# Patient Record
Sex: Male | Born: 1955 | Race: Black or African American | Hispanic: No | Marital: Married | Smoking: Current every day smoker
Health system: Southern US, Community
[De-identification: ages and names within clinical notes are randomized; demographics above are authoritative.]

## PROBLEM LIST (undated history)

## (undated) DIAGNOSIS — D369 Benign neoplasm, unspecified site: Secondary | ICD-10-CM

## (undated) DIAGNOSIS — C801 Malignant (primary) neoplasm, unspecified: Secondary | ICD-10-CM

## (undated) DIAGNOSIS — I10 Essential (primary) hypertension: Secondary | ICD-10-CM

## (undated) HISTORY — DX: Essential (primary) hypertension: I10

## (undated) HISTORY — DX: Benign neoplasm, unspecified site: D36.9

---

## 2009-04-23 ENCOUNTER — Ambulatory Visit (HOSPITAL_COMMUNITY): Admission: RE | Admit: 2009-04-23 | Discharge: 2009-04-23 | Payer: Self-pay | Admitting: Family Medicine

## 2009-04-23 IMAGING — CR DG CHEST 2V
2 series · 2 of 2 positions shown · non-contrast
Comparison: None

CLINICAL DATA: Smoking history

CHEST - 2 VIEW

[view not recorded (1 of 2)]
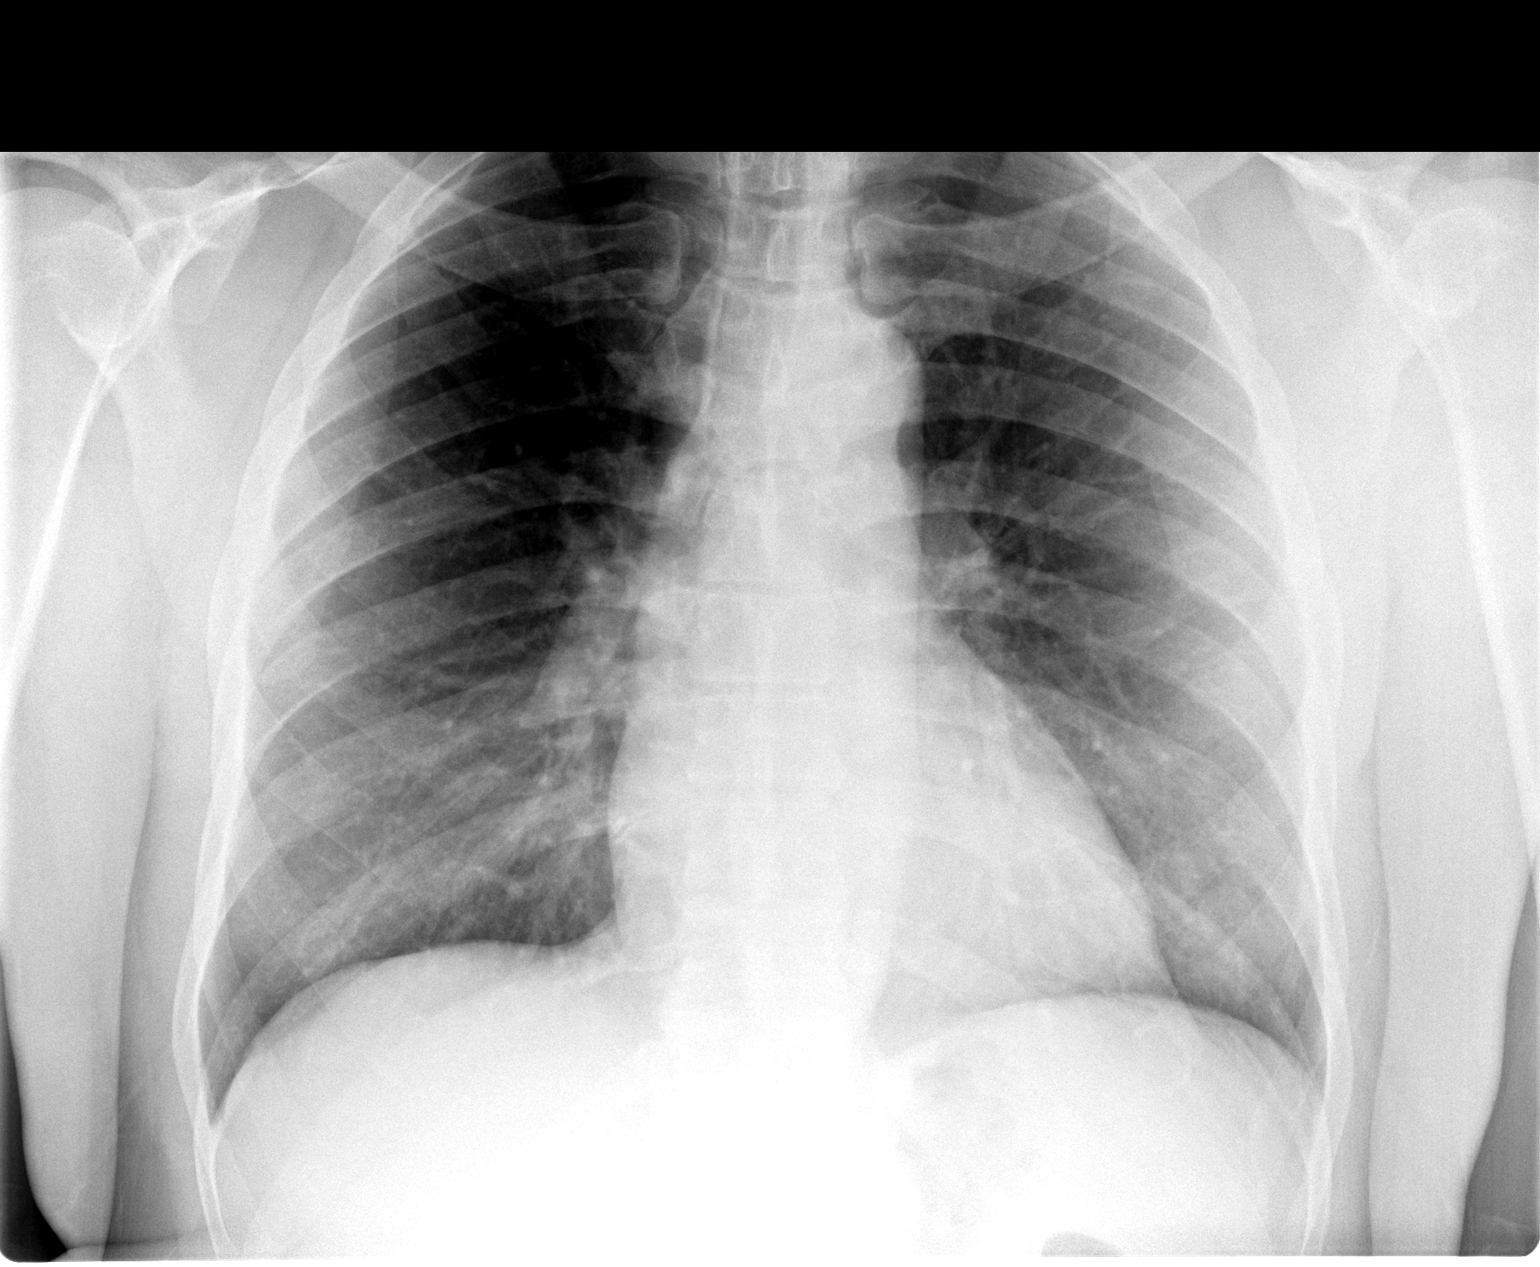

[view not recorded (2 of 2)]
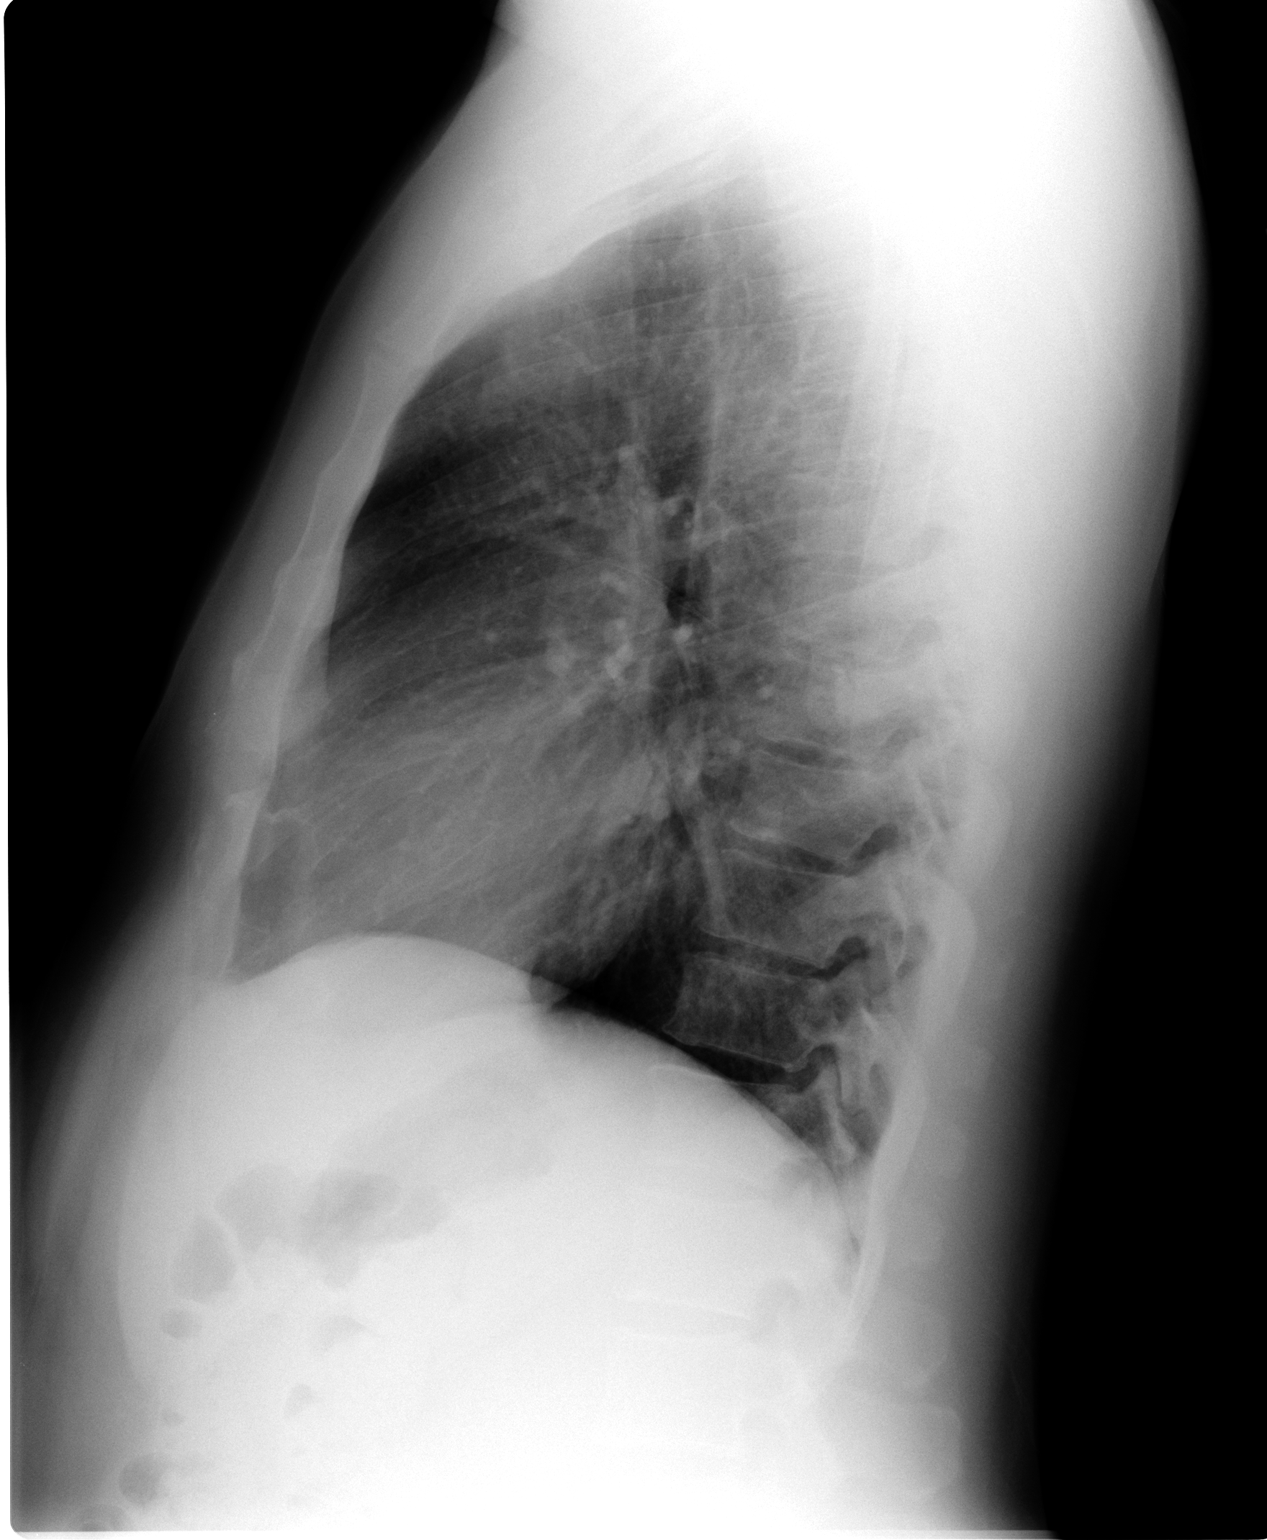

[2 of 2 positions shown; findings below may reference images not displayed]

FINDINGS: Heart size is normal.  The mediastinum is unremarkable.
Lungs are clear.  No effusions.  No soft tissue or bony finding.
IMPRESSION: Normal chest

## 2009-05-04 ENCOUNTER — Encounter: Payer: Self-pay | Admitting: Internal Medicine

## 2009-05-22 ENCOUNTER — Encounter: Payer: Self-pay | Admitting: Internal Medicine

## 2009-05-22 ENCOUNTER — Ambulatory Visit: Payer: Self-pay | Admitting: Internal Medicine

## 2009-05-22 ENCOUNTER — Ambulatory Visit (HOSPITAL_COMMUNITY): Admission: RE | Admit: 2009-05-22 | Discharge: 2009-05-22 | Payer: Self-pay | Admitting: Internal Medicine

## 2009-05-22 HISTORY — PX: COLONOSCOPY: SHX174

## 2009-05-31 ENCOUNTER — Encounter: Payer: Self-pay | Admitting: Internal Medicine

## 2011-03-22 NOTE — Op Note (Signed)
NAMEDOROTEO, NICKOLSON            ACCOUNT NO.:  [id number]   MEDICAL RECORD NO.:  [id number]          PATIENT TYPE:  AMB   LOCATION:  DAY                           FACILITY:  [facility]   PHYSICIAN:  R. Roetta Sessions, M.D. DATE OF BIRTH:  03-22-1956   DATE OF PROCEDURE:  05/22/2009  DATE OF DISCHARGE:                               OPERATIVE REPORT   PROCEDURE:  Colonoscopy with snare polypectomy.   INDICATIONS FOR PROCEDURE:  This is a 55 year old African American male  seen at the courtesy of Dr. Yetta Numbers due for colorectal cancer  screening.  He has never had his lower GI tract imaged and has no lower  GI tract symptoms and there is no family history of colon polyps or  colon cancer.  Colonoscopy is now being done as screening maneuver.  The  risks, benefits, alternatives and limitations have been reviewed,  questions answered.  Please see the documentation in the medical record.   PROCEDURE NOTE:  O2 saturation, blood pressure, pulse and respirations  were monitored throughout the entire procedure.  Conscious sedation with  Versed 4 mg IV and Demerol 75 mg IV in divided doses.   INSTRUMENT:  Pentax video chip system.   FINDINGS:  Digital rectal exam revealed no abnormalities.   ENDOSCOPIC FINDINGS:  The prep was adequate.  Colon:  Colonic mucosa was  surveyed from the rectosigmoid junction through the left transverse and  right colon to the appendiceal orifice, ileocecal valve/cecum.  These  structures were well seen and photographed for the record.  From this  level, the scope was slowly withdrawn.  All previously mentioned mucosal  surfaces were again seen.  The patient had an 8-mm polyp on a stalk at  the splenic flexure and a 9-mm polyp in the midsigmoid.  Both of these  polyps were resected with hot snare cautery and recovered.  The  remainder of the colonic mucosa appeared normal.  The scope was pulled  down in the rectum where a thorough examination of the rectal  mucosa,  including a retroflexed view of the anal verge demonstrated no  abnormalities.  The patient tolerated the procedure well and was reacted  in endoscopy.  Cecal withdrawal time 10 minutes.   IMPRESSION:  Normal rectum.  Polyps at the splenic flexure and sigmoid  as described above, status post hot snare removal.  The remainder of  colonic mucosa appeared normal.   RECOMMENDATIONS:  1. No aspirin or arthritic medications for 5 days.  2. Follow-up on path.  3. Further recommendations to follow.      Jonathon Bellows, M.D.  Electronically Signed     RMR/MEDQ  D:  05/22/2009  T:  05/22/2009  Job:  [id number]   cc:   Kirk Ruths, M.D.  Fax: [phone]

## 2014-05-13 ENCOUNTER — Encounter: Payer: Self-pay | Admitting: Internal Medicine

## 2014-07-18 ENCOUNTER — Ambulatory Visit: Payer: Self-pay | Admitting: Internal Medicine

## 2014-07-29 ENCOUNTER — Ambulatory Visit (INDEPENDENT_AMBULATORY_CARE_PROVIDER_SITE_OTHER): Payer: BC Managed Care – PPO | Admitting: Internal Medicine

## 2014-07-29 ENCOUNTER — Other Ambulatory Visit: Payer: Self-pay

## 2014-07-29 ENCOUNTER — Encounter: Payer: Self-pay | Admitting: Internal Medicine

## 2014-07-29 VITALS — BP 126/79 | HR 90 | Temp 97.6°F | Ht 72.0 in | Wt 231.4 lb

## 2014-07-29 DIAGNOSIS — Z860100 Personal history of colon polyps, unspecified: Secondary | ICD-10-CM

## 2014-07-29 MED ORDER — PEG-KCL-NACL-NASULF-NA ASC-C 100 G PO SOLR: 1.0000 | ORAL | Status: DC

## 2014-07-29 NOTE — Progress Notes (Signed)
    Primary Care Physician:  No primary provider on file. Primary Gastroenterologist:  Dr. Gala Romney  Pre-Procedure History & Physical: HPI:  Jamie Gonzalez is a 58 y.o. male here to set up surveillance colonoscopy. Had 2 adenomas removed from his colon in 2010. No bowel symptoms currently. No family history of colon cancer. No interim health problems.   Past Medical History  Diagnosis Date  . Tubular adenoma     Past Surgical History  Procedure Laterality Date  . Colonoscopy  05/22/2009    Dr.Michaele Amundson- normal rectum, polyps at the splenic flexure and sigmoid, the remainder of the colonic mucosa appeared normal. bx= tubular adenoma    Prior to Admission medications   Medication Sig Start Date End Date Taking? Authorizing Provider  Multiple Vitamin (MULTIVITAMIN) tablet Take 1 tablet by mouth daily.   Yes Historical Provider, MD    Allergies as of 07/29/2014  . (No Known Allergies)    No family history on file.  History   Social History  . Marital Status: Married    Spouse Name: N/A    Number of Children: N/A  . Years of Education: N/A   Occupational History  . Not on file.   Social History Main Topics  . Smoking status: Current Every Day Smoker -- 0.50 packs/day    Types: Cigarettes  . Smokeless tobacco: Not on file     Comment: 1/2 pack daily  . Alcohol Use: Not on file  . Drug Use: Not on file  . Sexual Activity: Not on file   Other Topics Concern  . Not on file   Social History Narrative  . No narrative on file    Review of Systems: See HPI, otherwise negative ROS  Physical Exam: BP 126/79  Pulse 90  Temp(Src) 97.6 F (36.4 C) (Oral)  Ht 6' (1.829 m)  Wt 231 lb 6.4 oz (104.962 kg)  BMI 31.38 kg/m2 General:   Alert,  Well-developed, well-nourished, pleasant and cooperative in NAD. Accompanied by his wife. Skin:  Intact without significant lesions or rashes. Eyes:  Sclera clear, no icterus.   Conjunctiva pink. Ears:  Normal auditory acuity. Nose:   No deformity, discharge,  or lesions. Mouth:  No deformity or lesions. Neck:  Supple; no masses or thyromegaly. No significant cervical adenopathy. Lungs:  Clear throughout to auscultation.   No wheezes, crackles, or rhonchi. No acute distress. Heart:  Regular rate and rhythm; no murmurs, clicks, rubs,  or gallops. Abdomen: Non-distended, normal bowel sounds.  Soft and nontender without appreciable mass or hepatosplenomegaly.  Pulses:  Normal pulses noted. Extremities:  Without clubbing or edema.  Impression:  Pleasant 58 year old gentleman with a history of colonic adenomas; due for surveillance examination at this time.  The risks, benefits, limitations, imponderables and alternatives regarding both EGD and colonoscopy have been reviewed with the patient. Questions have been answered. All parties agreeable.   Recommendations:  Schedule a surveillance colonoscopy (history of polyps)  Split movie prep  Further recommendations to follow    Notice: This dictation was prepared with Dragon dictation along with smaller phrase technology. Any transcriptional errors that result from this process are unintentional and may not be corrected upon review.

## 2014-07-29 NOTE — Patient Instructions (Signed)
Schedule a surveillance colonoscopy (history of polyps)  Split movie prep  Further recommendations to follow

## 2014-08-05 ENCOUNTER — Encounter (HOSPITAL_COMMUNITY): Payer: Self-pay | Admitting: Pharmacy Technician

## 2014-08-06 DIAGNOSIS — Z860100 Personal history of colon polyps, unspecified: Secondary | ICD-10-CM | POA: Insufficient documentation

## 2014-08-19 ENCOUNTER — Encounter (HOSPITAL_COMMUNITY)
Admission: RE | Disposition: A | Discharge status: Home / Self Care | Payer: Self-pay | Source: Ambulatory Visit | Attending: Internal Medicine

## 2014-08-19 ENCOUNTER — Encounter (HOSPITAL_COMMUNITY): Payer: Self-pay

## 2014-08-19 ENCOUNTER — Ambulatory Visit (HOSPITAL_COMMUNITY)
Admission: RE | Admit: 2014-08-19 | Discharge: 2014-08-19 | Disposition: A | Discharge status: Home / Self Care | Payer: BC Managed Care – PPO | Source: Ambulatory Visit | Attending: Internal Medicine | Admitting: Internal Medicine

## 2014-08-19 DIAGNOSIS — F17200 Nicotine dependence, unspecified, uncomplicated: Secondary | ICD-10-CM | POA: Diagnosis not present

## 2014-08-19 DIAGNOSIS — Z86010 Personal history of colonic polyps: Secondary | ICD-10-CM | POA: Insufficient documentation

## 2014-08-19 DIAGNOSIS — Z1211 Encounter for screening for malignant neoplasm of colon: Secondary | ICD-10-CM | POA: Diagnosis present

## 2014-08-19 HISTORY — PX: COLONOSCOPY: SHX5424

## 2014-08-19 SURGERY — COLONOSCOPY
Anesthesia: Moderate Sedation

## 2014-08-19 MED ORDER — MIDAZOLAM HCL 5 MG/5ML IJ SOLN
INTRAMUSCULAR | Status: AC
  Filled 2014-08-19: qty 10

## 2014-08-19 MED ORDER — SIMETHICONE 40 MG/0.6ML PO SUSP
ORAL | Status: DC | PRN
  Administered 2014-08-19: 11:00:00

## 2014-08-19 MED ORDER — ONDANSETRON HCL 4 MG/2ML IJ SOLN
INTRAMUSCULAR | Status: DC | PRN
  Administered 2014-08-19: 4 mg via INTRAVENOUS

## 2014-08-19 MED ORDER — MEPERIDINE HCL 100 MG/ML IJ SOLN
INTRAMUSCULAR | Status: DC | PRN
  Administered 2014-08-19 (×2): 50 mg via INTRAVENOUS

## 2014-08-19 MED ORDER — MIDAZOLAM HCL 5 MG/5ML IJ SOLN
INTRAMUSCULAR | Status: DC | PRN
  Administered 2014-08-19 (×2): 2 mg via INTRAVENOUS

## 2014-08-19 MED ORDER — ONDANSETRON HCL 4 MG/2ML IJ SOLN
INTRAMUSCULAR | Status: AC
  Filled 2014-08-19: qty 2

## 2014-08-19 MED ORDER — SODIUM CHLORIDE 0.9 % IV SOLN
INTRAVENOUS | Status: DC
  Administered 2014-08-19: 11:00:00 via INTRAVENOUS

## 2014-08-19 MED ORDER — MEPERIDINE HCL 100 MG/ML IJ SOLN
INTRAMUSCULAR | Status: AC
  Filled 2014-08-19: qty 2

## 2014-08-19 NOTE — Discharge Instructions (Signed)
°  Colonoscopy Discharge Instructions  Read the instructions outlined below and refer to this sheet in the next few weeks. These discharge instructions provide you with general information on caring for yourself after you leave the hospital. Your doctor may also give you specific instructions. While your treatment has been planned according to the most current medical practices available, unavoidable complications occasionally occur. If you have any problems or questions after discharge, call Dr. Gala Romney at [phone]. ACTIVITY  You may resume your regular activity, but move at a slower pace for the next 24 hours.   Take frequent rest periods for the next 24 hours.   Walking will help get rid of the air and reduce the bloated feeling in your belly (abdomen).   No driving for 24 hours (because of the medicine (anesthesia) used during the test).    Do not sign any important legal documents or operate any machinery for 24 hours (because of the anesthesia used during the test).  NUTRITION  Drink plenty of fluids.   You may resume your normal diet as instructed by your doctor.   Begin with a light meal and progress to your normal diet. Heavy or fried foods are harder to digest and may make you feel sick to your stomach (nauseated).   Avoid alcoholic beverages for 24 hours or as instructed.  MEDICATIONS  You may resume your normal medications unless your doctor tells you otherwise.  WHAT YOU CAN EXPECT TODAY  Some feelings of bloating in the abdomen.   Passage of more gas than usual.   Spotting of blood in your stool or on the toilet paper.  IF YOU HAD POLYPS REMOVED DURING THE COLONOSCOPY:  No aspirin products for 7 days or as instructed.   No alcohol for 7 days or as instructed.   Eat a soft diet for the next 24 hours.  FINDING OUT THE RESULTS OF YOUR TEST Not all test results are available during your visit. If your test results are not back during the visit, make an appointment  with your caregiver to find out the results. Do not assume everything is normal if you have not heard from your caregiver or the medical facility. It is important for you to follow up on all of your test results.  SEEK IMMEDIATE MEDICAL ATTENTION IF:  You have more than a spotting of blood in your stool.   Your belly is swollen (abdominal distention).   You are nauseated or vomiting.   You have a temperature over 101.   You have abdominal pain or discomfort that is severe or gets worse throughout the day.   Repeat screening colonoscopy in 7 years

## 2014-08-19 NOTE — Op Note (Signed)
[facility] [address] [place], [address]   COLONOSCOPY PROCEDURE REPORT  PATIENT: Jamie Gonzalez, Jamie Gonzalez  MR#: [id number] BIRTHDATE: 04-19-1956 , 1  yrs. old GENDER: male ENDOSCOPIST: R.  Garfield Cornea, MD Quentin Ore REFERRED BY:   no referring physician PROCEDURE DATE:  08/26/2014 PROCEDURE:   Colonoscopy, surveillance INDICATIONS:surveillance colonoscopy based on a history of adenomatous colonic polyp(s). MEDICATIONS: Versed 4 mg IV and Demerol 100 mg IV in divided doses. Zofran 4 mg IV ASA CLASS:       Class II  CONSENT: The risks, benefits, alternatives and imponderables including but not limited to bleeding, perforation as well as the possibility of a missed lesion have been reviewed.  The potential for biopsy, lesion removal, etc. have also been discussed. Questions have been answered.  All parties agreeable.  Please see the history and physical in the medical record for more information.  DESCRIPTION OF PROCEDURE:   After the risks benefits and alternatives of the procedure were thoroughly explained, informed consent was obtained.  The digital rectal exam      The EC-[id number] ([id number])  endoscope was introduced through the anus and advanced to the cecum, which was identified by both the appendix and ileocecal valve. No adverse events experienced.   The quality of the prep was adequate.  The instrument was then slowly withdrawn as the colon was fully examined.      COLON FINDINGS: Normal appearing rectal and colonic mucosa. Retroflexion was performed. .  Withdrawal time=8 minutes 0 seconds.  The scope was withdrawn and the procedure completed. COMPLICATIONS: There were no immediate complications.  ENDOSCOPIC IMPRESSION: Normal colonoscopy  RECOMMENDATIONS: Repeat colonoscopy in 7 years  eSigned:  R. Garfield Cornea, MD Rosalita Chessman [facility] 08-26-14 12:03 PM   cc:  CPT CODES: ICD CODES:  The ICD and CPT codes recommended by this software  are interpretations from the data that the clinical staff has captured with the software.  The verification of the translation of this report to the ICD and CPT codes and modifiers is the sole responsibility of the health care institution and practicing physician where this report was generated.  [employer] will not be held responsible for the validity of the ICD and CPT codes included on this report.  AMA assumes no liability for data contained or not contained herein. CPT is a Designer, television/film set of the [employer].  PATIENT NAME:  Jamie Gonzalez, Jamie Gonzalez MR#: [id number]

## 2014-08-19 NOTE — H&P (View-Only) (Signed)
    Primary Care Physician:  No primary provider on file. Primary Gastroenterologist:  Dr. Gala Romney  Pre-Procedure History & Physical: HPI:  Jamie Gonzalez is a 58 y.o. male here to set up surveillance colonoscopy. Had 2 adenomas removed from his colon in 2010. No bowel symptoms currently. No family history of colon cancer. No interim health problems.   Past Medical History  Diagnosis Date  . Tubular adenoma     Past Surgical History  Procedure Laterality Date  . Colonoscopy  05/22/2009    Dr.Kierstin January- normal rectum, polyps at the splenic flexure and sigmoid, the remainder of the colonic mucosa appeared normal. bx= tubular adenoma    Prior to Admission medications   Medication Sig Start Date End Date Taking? Authorizing Provider  Multiple Vitamin (MULTIVITAMIN) tablet Take 1 tablet by mouth daily.   Yes Historical Provider, MD    Allergies as of 07/29/2014  . (No Known Allergies)    No family history on file.  History   Social History  . Marital Status: Married    Spouse Name: N/A    Number of Children: N/A  . Years of Education: N/A   Occupational History  . Not on file.   Social History Main Topics  . Smoking status: Current Every Day Smoker -- 0.50 packs/day    Types: Cigarettes  . Smokeless tobacco: Not on file     Comment: 1/2 pack daily  . Alcohol Use: Not on file  . Drug Use: Not on file  . Sexual Activity: Not on file   Other Topics Concern  . Not on file   Social History Narrative  . No narrative on file    Review of Systems: See HPI, otherwise negative ROS  Physical Exam: BP 126/79  Pulse 90  Temp(Src) 97.6 F (36.4 C) (Oral)  Ht 6' (1.829 m)  Wt 231 lb 6.4 oz (104.962 kg)  BMI 31.38 kg/m2 General:   Alert,  Well-developed, well-nourished, pleasant and cooperative in NAD. Accompanied by his wife. Skin:  Intact without significant lesions or rashes. Eyes:  Sclera clear, no icterus.   Conjunctiva pink. Ears:  Normal auditory acuity. Nose:   No deformity, discharge,  or lesions. Mouth:  No deformity or lesions. Neck:  Supple; no masses or thyromegaly. No significant cervical adenopathy. Lungs:  Clear throughout to auscultation.   No wheezes, crackles, or rhonchi. No acute distress. Heart:  Regular rate and rhythm; no murmurs, clicks, rubs,  or gallops. Abdomen: Non-distended, normal bowel sounds.  Soft and nontender without appreciable mass or hepatosplenomegaly.  Pulses:  Normal pulses noted. Extremities:  Without clubbing or edema.  Impression:  Pleasant 58 year old gentleman with a history of colonic adenomas; due for surveillance examination at this time.  The risks, benefits, limitations, imponderables and alternatives regarding both EGD and colonoscopy have been reviewed with the patient. Questions have been answered. All parties agreeable.   Recommendations:  Schedule a surveillance colonoscopy (history of polyps)  Split movie prep  Further recommendations to follow    Notice: This dictation was prepared with Dragon dictation along with smaller phrase technology. Any transcriptional errors that result from this process are unintentional and may not be corrected upon review.

## 2014-08-19 NOTE — Interval H&P Note (Signed)
History and Physical Interval Note:  08/19/2014 11:32 AM  Jamie Gonzalez  has presented today for surgery, with the diagnosis of history of polyps  The various methods of treatment have been discussed with the patient and family. After consideration of risks, benefits and other options for treatment, the patient has consented to  Procedure(s) with comments: COLONOSCOPY (N/A) - 1130 as a surgical intervention .  The patient's history has been reviewed, patient examined, no change in status, stable for surgery.  I have reviewed the patient's chart and labs.  Questions were answered to the patient's satisfaction.     Jamie Gonzalez  No change. Colonoscopy per plan.  The risks, benefits, limitations, alternatives and imponderables have been reviewed with the patient. Questions have been answered. All parties are agreeable.

## 2014-08-20 ENCOUNTER — Encounter (HOSPITAL_COMMUNITY): Payer: Self-pay | Admitting: Internal Medicine

## 2019-12-06 ENCOUNTER — Ambulatory Visit: Payer: Self-pay | Attending: Internal Medicine

## 2019-12-06 ENCOUNTER — Other Ambulatory Visit: Payer: Self-pay

## 2019-12-06 DIAGNOSIS — Z20822 Contact with and (suspected) exposure to covid-19: Secondary | ICD-10-CM | POA: Insufficient documentation

## 2019-12-07 LAB — NOVEL CORONAVIRUS, NAA: SARS-CoV-2, NAA: NOT DETECTED

## 2020-01-02 ENCOUNTER — Encounter: Payer: Self-pay | Admitting: Physician Assistant

## 2020-01-02 ENCOUNTER — Ambulatory Visit: Payer: Self-pay | Attending: Internal Medicine

## 2020-01-02 DIAGNOSIS — Z20822 Contact with and (suspected) exposure to covid-19: Secondary | ICD-10-CM

## 2020-01-02 DIAGNOSIS — U071 COVID-19: Secondary | ICD-10-CM | POA: Insufficient documentation

## 2020-01-03 LAB — NOVEL CORONAVIRUS, NAA: SARS-CoV-2, NAA: DETECTED — AB

## 2020-01-04 ENCOUNTER — Other Ambulatory Visit: Payer: Self-pay | Admitting: Physician Assistant

## 2020-01-04 ENCOUNTER — Other Ambulatory Visit: Payer: Self-pay | Admitting: Adult Health

## 2020-01-04 ENCOUNTER — Telehealth: Payer: Self-pay

## 2020-01-04 ENCOUNTER — Ambulatory Visit (HOSPITAL_COMMUNITY)
Admission: RE | Admit: 2020-01-04 | Discharge: 2020-01-04 | Disposition: A | Discharge status: Home / Self Care | Payer: HRSA Program | Source: Ambulatory Visit | Attending: Pulmonary Disease | Admitting: Pulmonary Disease

## 2020-01-04 DIAGNOSIS — Z23 Encounter for immunization: Secondary | ICD-10-CM | POA: Diagnosis present

## 2020-01-04 DIAGNOSIS — U071 COVID-19: Secondary | ICD-10-CM | POA: Diagnosis not present

## 2020-01-04 DIAGNOSIS — I10 Essential (primary) hypertension: Secondary | ICD-10-CM | POA: Diagnosis not present

## 2020-01-04 MED ORDER — SODIUM CHLORIDE 0.9 % IV SOLN
INTRAVENOUS | Status: DC | PRN
  Administered 2020-01-04: 250 mL via INTRAVENOUS

## 2020-01-04 MED ORDER — DIPHENHYDRAMINE HCL 50 MG/ML IJ SOLN: 50.0000 mg | Freq: Once | INTRAMUSCULAR | Status: DC | PRN

## 2020-01-04 MED ORDER — SODIUM CHLORIDE 0.9 % IV SOLN
700.0000 mg | Freq: Once | INTRAVENOUS | Status: AC
  Administered 2020-01-04: 11:00:00 700 mg via INTRAVENOUS
  Filled 2020-01-04: qty 20

## 2020-01-04 MED ORDER — EPINEPHRINE 0.3 MG/0.3ML IJ SOAJ: 0.3000 mg | Freq: Once | INTRAMUSCULAR | Status: DC | PRN

## 2020-01-04 MED ORDER — FAMOTIDINE IN NACL 20-0.9 MG/50ML-% IV SOLN: 20.0000 mg | Freq: Once | INTRAVENOUS | Status: DC | PRN

## 2020-01-04 MED ORDER — ALBUTEROL SULFATE HFA 108 (90 BASE) MCG/ACT IN AERS: 2.0000 | INHALATION_SPRAY | Freq: Once | RESPIRATORY_TRACT | Status: DC | PRN

## 2020-01-04 MED ORDER — METHYLPREDNISOLONE SODIUM SUCC 125 MG IJ SOLR: 125.0000 mg | Freq: Once | INTRAMUSCULAR | Status: DC | PRN

## 2020-01-04 NOTE — Telephone Encounter (Signed)
[facility] form [place] called for pt's additional phone numbers.

## 2020-01-04 NOTE — Progress Notes (Signed)
  I connected by phone with Jamie Gonzalez on 01/04/2020 at 8:33 AM to discuss the potential use of an new treatment for mild to moderate COVID-19 viral infection in non-hospitalized patients.  This patient is a 64 y.o. male that meets the FDA criteria for Emergency Use Authorization of bamlanivimab or casirivimab\imdevimab.  Has a (+) direct SARS-CoV-2 viral test result  Has mild or moderate COVID-19   Is ? 65 years of age and weighs ? 40 kg  Is NOT hospitalized due to COVID-19  Is NOT requiring oxygen therapy or requiring an increase in baseline oxygen flow rate due to COVID-19  Is within 10 days of symptom onset  Has at least one of the high risk factor(s) for progression to severe COVID-19 and/or hospitalization as defined in EUA.  Specific high risk criteria : Hypertension   I have spoken and communicated the following to the patient or parent/caregiver:  1. FDA has authorized the emergency use of bamlanivimab and casirivimab\imdevimab for the treatment of mild to moderate COVID-19 in adults and pediatric patients with positive results of direct SARS-CoV-2 viral testing who are 12 years of age and older weighing at least 40 kg, and who are at high risk for progressing to severe COVID-19 and/or hospitalization.  2. The significant known and potential risks and benefits of bamlanivimab and casirivimab\imdevimab, and the extent to which such potential risks and benefits are unknown.  3. Information on available alternative treatments and the risks and benefits of those alternatives, including clinical trials.  4. Patients treated with bamlanivimab and casirivimab\imdevimab should continue to self-isolate and use infection control measures (e.g., wear mask, isolate, social distance, avoid sharing personal items, clean and disinfect "high touch" surfaces, and frequent handwashing) according to CDC guidelines.   5. The patient or parent/caregiver has the option to accept or refuse  bamlanivimab or casirivimab\imdevimab .  After reviewing this information with the patient, The patient agreed to proceed with receiving the bamlanimivab infusion and will be provided a copy of the Fact sheet prior to receiving the infusion.  Scheduled 01/04/20 at 1030  . [name] 01/04/2020 8:33 AM

## 2020-01-04 NOTE — Progress Notes (Signed)
  Diagnosis: COVID-19  Physician:  Procedure: [facility] Med: bamlanivimab infusion - Provided patient with bamlanimivab fact sheet for patients, parents and caregivers prior to infusion.  Complications: No immediate complications noted.  Discharge: Discharged home   Virgilio Belling 01/04/2020

## 2020-06-11 DIAGNOSIS — E6609 Other obesity due to excess calories: Secondary | ICD-10-CM | POA: Diagnosis not present

## 2020-06-11 DIAGNOSIS — Z1389 Encounter for screening for other disorder: Secondary | ICD-10-CM | POA: Diagnosis not present

## 2020-06-11 DIAGNOSIS — Z6831 Body mass index (BMI) 31.0-31.9, adult: Secondary | ICD-10-CM | POA: Diagnosis not present

## 2020-06-11 DIAGNOSIS — E7849 Other hyperlipidemia: Secondary | ICD-10-CM | POA: Diagnosis not present

## 2020-06-11 DIAGNOSIS — Z0000 Encounter for general adult medical examination without abnormal findings: Secondary | ICD-10-CM | POA: Diagnosis not present

## 2020-11-12 DIAGNOSIS — Z23 Encounter for immunization: Secondary | ICD-10-CM | POA: Diagnosis not present

## 2021-04-01 ENCOUNTER — Other Ambulatory Visit (HOSPITAL_COMMUNITY): Payer: Self-pay | Admitting: Physician Assistant

## 2021-04-07 ENCOUNTER — Ambulatory Visit (HOSPITAL_COMMUNITY)
Admission: RE | Admit: 2021-04-07 | Discharge: 2021-04-07 | Disposition: A | Discharge status: Home / Self Care | Payer: Self-pay | Source: Ambulatory Visit | Attending: Physician Assistant | Admitting: Physician Assistant

## 2021-04-07 ENCOUNTER — Other Ambulatory Visit: Payer: Self-pay

## 2021-04-07 DIAGNOSIS — Z0000 Encounter for general adult medical examination without abnormal findings: Secondary | ICD-10-CM | POA: Insufficient documentation

## 2021-04-07 DIAGNOSIS — I700 Atherosclerosis of aorta: Secondary | ICD-10-CM | POA: Insufficient documentation

## 2021-04-07 IMAGING — CT CT CARDIAC CORONARY ARTERY CALCIUM SCORE
3 series · 14 of 20 positions shown, 15 images · non-contrast
Comparison: None.

Addendum:
CLINICAL DATA: 64M for cardiovascular disease risk stratification

EXAM:
Coronary Calcium Score
TECHNIQUE: A gated, non-contrast computed tomography scan of the heart was
performed using 3mm slice thickness. Axial images were analyzed on a
dedicated workstation. Calcium scoring of the coronary arteries was
performed using the Agatston method.

[Series 3: ax ca scr 70% (id) · axial · 0.39mm/px · z∈[+1378,+1484]mm · 6 of 75 slices shown]
[im 11/75  vessel]
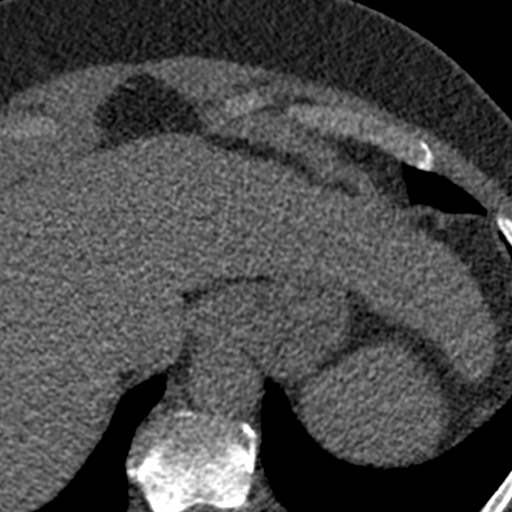
[im 22/75  vessel]
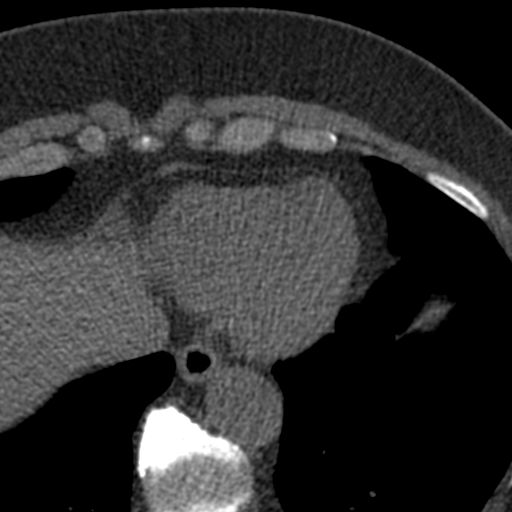
[im 32/75  vessel]
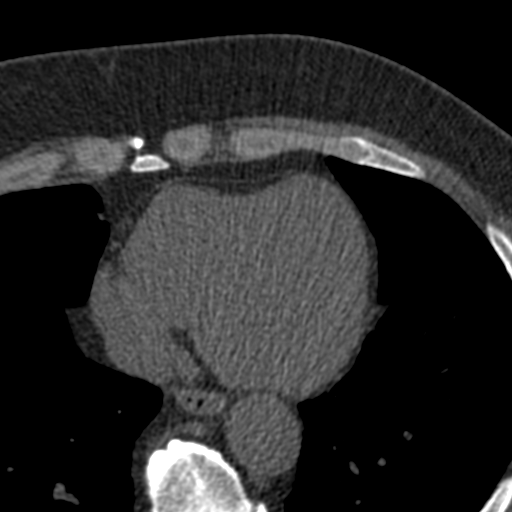
[im 43/75  vessel]
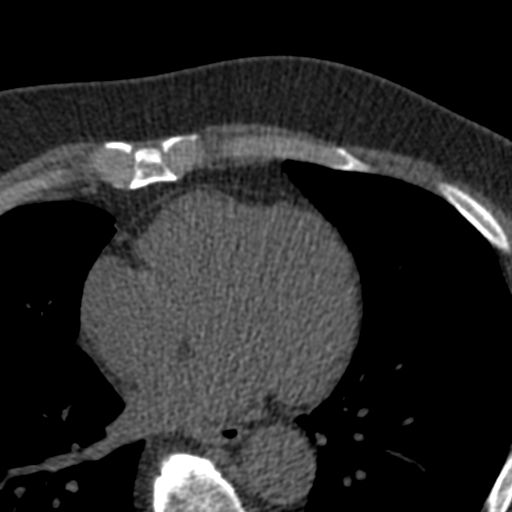
[im 53/75  vessel]
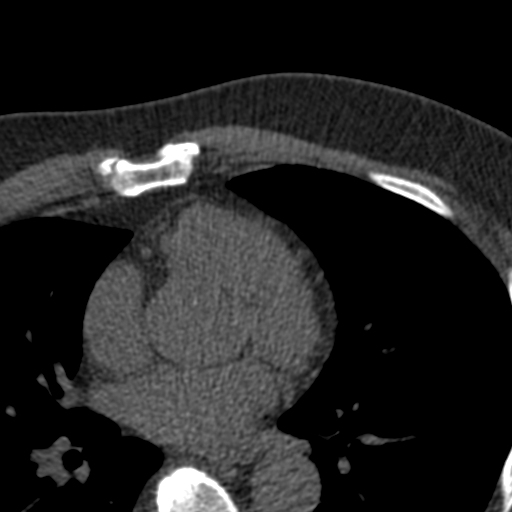
[im 64/75  vessel]
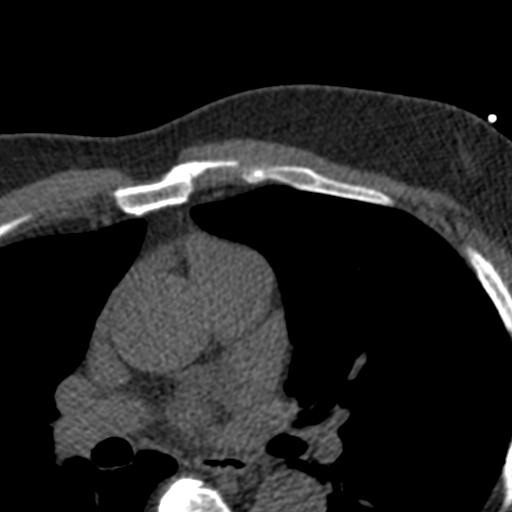

[Series 4: ax st · axial · 0.68mm/px · z∈[+1386,+1476]mm · 4 of 50 slices shown, 5 images]
[im 10/50  vessel]
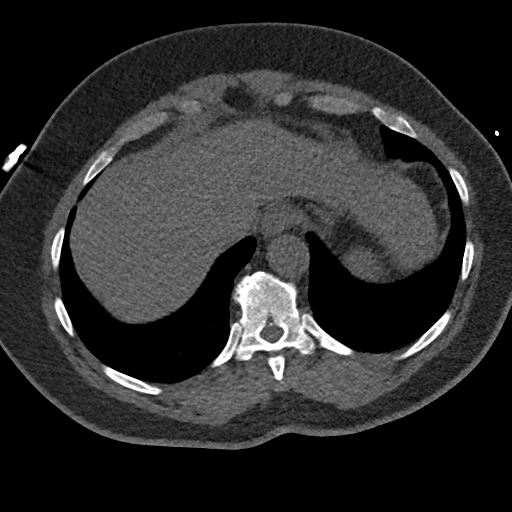
[im 10/50  lung]
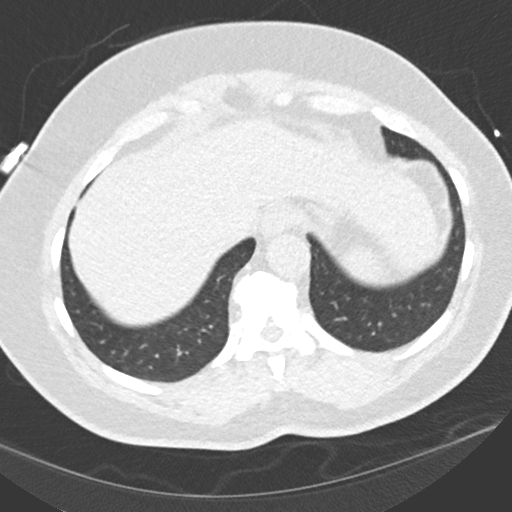
[im 20/50  vessel]
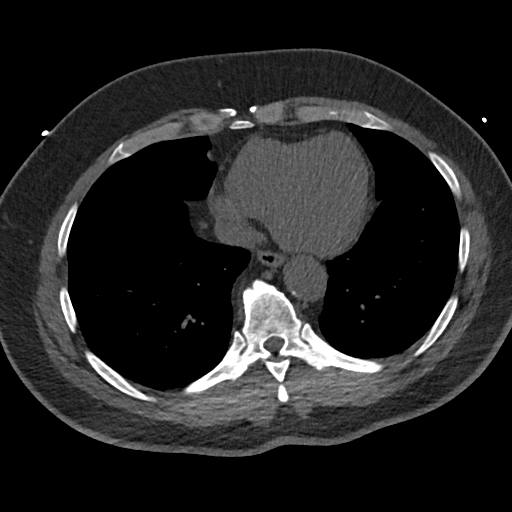
[im 30/50  vessel]
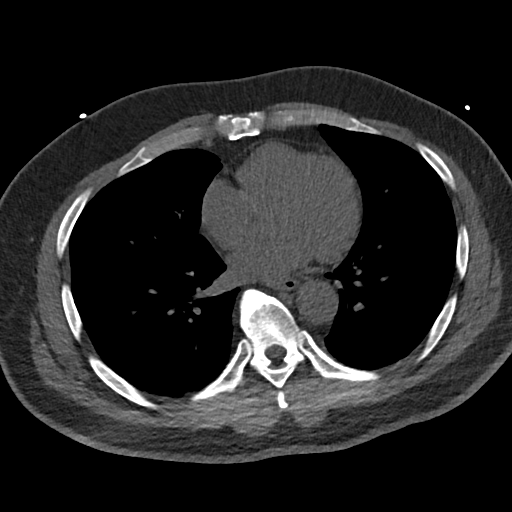
[im 40/50  vessel]
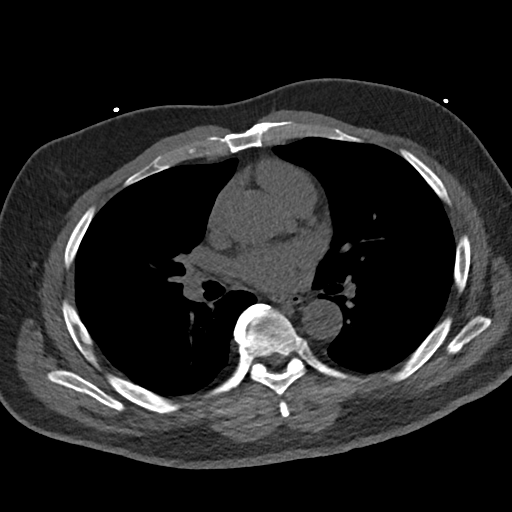

[Series 5: ax lung · axial · 0.68mm/px · z∈[+1386,+1476]mm · 4 of 50 slices shown]
[im 10/50  lung]
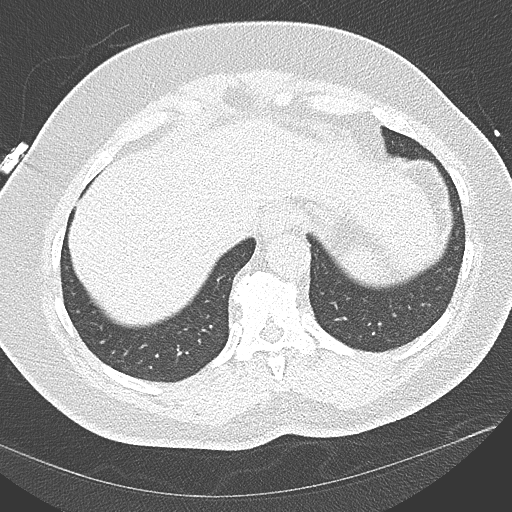
[im 20/50  lung]
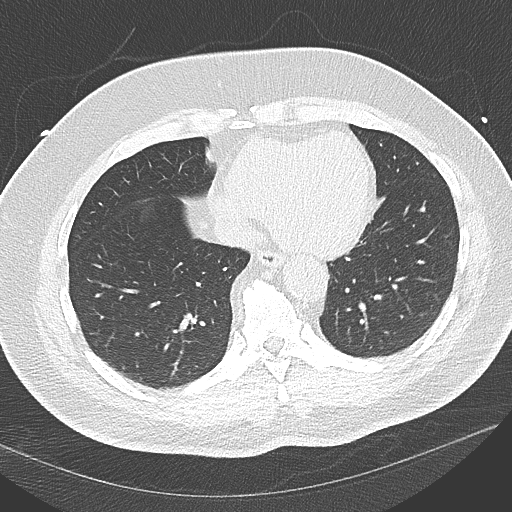
[im 30/50  lung]
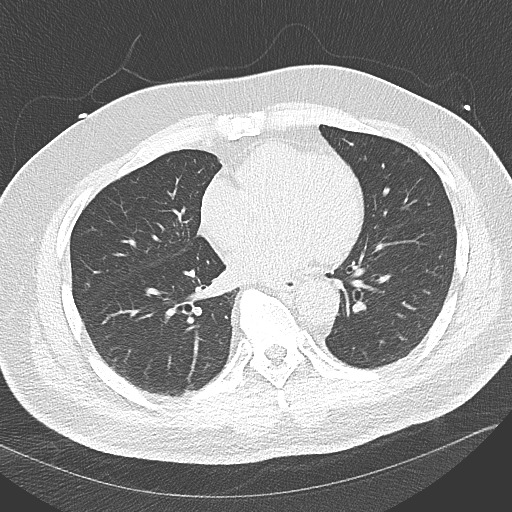
[im 40/50  lung]
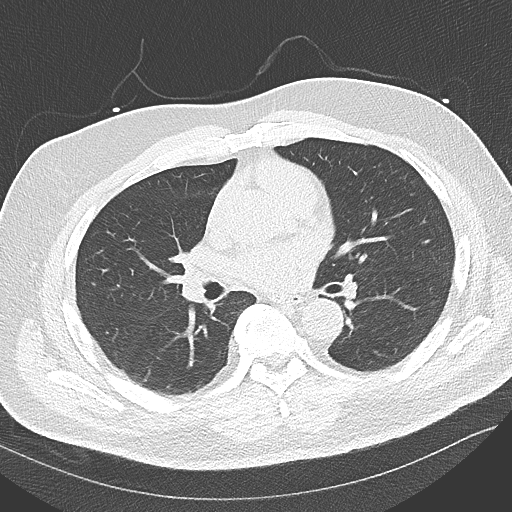

[14 of 20 positions shown; findings below may reference images not displayed]

FINDINGS: Coronary arteries: Normal origins.

Coronary Calcium Score:

Left main: 0

Left anterior descending artery: 0

Left circumflex artery: 0

Right coronary artery:

Total:

Percentile: 44th

Pericardium: Normal.

Ascending Aorta: Mildly dilated. 3.8 cm. Minimal calcification of
the aortic root.

Non-cardiac: See separate report from [REDACTED].
IMPRESSION: Coronary calcium score of 1.53. This was 44th percentile for age-,
race-, and sex-matched controls.

Ascending aorta mildly dilated (3.8 cm).

Interpretation of the non-cardiac thoracic structures is pending.



If CAC=0, it is reasonable to withhold statin therapy and reassess
in 5 to 10 years, as long as higher risk conditions are absent
(diabetes mellitus, family history of premature CHD in first degree
relatives (males <55 years; females <65 years), cigarette smoking,
or LDL >=190 mg/dL).

If CAC is 1 to 99, it is reasonable to initiate statin therapy for
patients >=55 years of age.

If CAC is >=100 or >=75th percentile, it is reasonable to initiate
statin therapy at any age.

Cardiology referral should be considered for patients with CAC
scores >=400 or >=75th percentile.

*[0N] AHA/ACC/AACVPR/AAPA/ABC/MOHAMUD/MOHAMUD/MOHAMUD/MOHAMUD/MOHAMUD/MOHAMUD/MOHAMUD
Guideline on the Management of Blood Cholesterol: A Report of the
American College of Cardiology/American Heart Association Task Force
on Clinical Practice Guidelines. J Am Coll Cardiol.
[0N];73(24):[PHONE_NUMBER].

EXAM:
OVER-READ INTERPRETATION  CT CHEST

The following report is an over-read performed by radiologist Dr.
over-read does not include interpretation of cardiac or coronary
anatomy or pathology. The coronary calcium score interpretation by
the cardiologist is attached.
FINDINGS: Atherosclerotic calcifications in the thoracic aorta. Within the
visualized portions of the thorax there are no suspicious appearing
pulmonary nodules or masses, there is no acute consolidative
airspace disease, no pleural effusions, no pneumothorax and no
lymphadenopathy. Visualized portions of the upper abdomen are
unremarkable. There are no aggressive appearing lytic or blastic
lesions noted in the visualized portions of the skeleton.
IMPRESSION: 1.  Aortic Atherosclerosis ([0N]-[0N]).

*** End of Addendum ***
FINDINGS: Coronary arteries: Normal origins.

Coronary Calcium Score:

Left main: 0

Left anterior descending artery: 0

Left circumflex artery: 0

Right coronary artery:

Total:

Percentile: 44th

Pericardium: Normal.

Ascending Aorta: Mildly dilated. 3.8 cm. Minimal calcification of
the aortic root.

Non-cardiac: See separate report from [REDACTED].
IMPRESSION: Coronary calcium score of 1.53. This was 44th percentile for age-,
race-, and sex-matched controls.

Ascending aorta mildly dilated (3.8 cm).

Interpretation of the non-cardiac thoracic structures is pending.



If CAC=0, it is reasonable to withhold statin therapy and reassess
in 5 to 10 years, as long as higher risk conditions are absent
(diabetes mellitus, family history of premature CHD in first degree
relatives (males <55 years; females <65 years), cigarette smoking,
or LDL >=190 mg/dL).

If CAC is 1 to 99, it is reasonable to initiate statin therapy for
patients >=55 years of age.

If CAC is >=100 or >=75th percentile, it is reasonable to initiate
statin therapy at any age.

Cardiology referral should be considered for patients with CAC
scores >=400 or >=75th percentile.

*[0N] AHA/ACC/AACVPR/AAPA/ABC/MOHAMUD/MOHAMUD/MOHAMUD/MOHAMUD/MOHAMUD/MOHAMUD/MOHAMUD
Guideline on the Management of Blood Cholesterol: A Report of the
American College of Cardiology/American Heart Association Task Force
on Clinical Practice Guidelines. J Am Coll Cardiol.
[0N];73(24):[PHONE_NUMBER].

## 2021-04-08 ENCOUNTER — Ambulatory Visit (HOSPITAL_COMMUNITY): Admission: RE | Admit: 2021-04-08 | Payer: Self-pay | Source: Ambulatory Visit

## 2021-07-01 DIAGNOSIS — Z0001 Encounter for general adult medical examination with abnormal findings: Secondary | ICD-10-CM | POA: Diagnosis not present

## 2021-07-01 DIAGNOSIS — N401 Enlarged prostate with lower urinary tract symptoms: Secondary | ICD-10-CM | POA: Diagnosis not present

## 2021-07-01 DIAGNOSIS — Z6832 Body mass index (BMI) 32.0-32.9, adult: Secondary | ICD-10-CM | POA: Diagnosis not present

## 2021-07-01 DIAGNOSIS — Z1389 Encounter for screening for other disorder: Secondary | ICD-10-CM | POA: Diagnosis not present

## 2021-07-01 DIAGNOSIS — I4940 Unspecified premature depolarization: Secondary | ICD-10-CM | POA: Diagnosis not present

## 2021-07-01 DIAGNOSIS — E6609 Other obesity due to excess calories: Secondary | ICD-10-CM | POA: Diagnosis not present

## 2021-07-01 DIAGNOSIS — Z1331 Encounter for screening for depression: Secondary | ICD-10-CM | POA: Diagnosis not present

## 2021-07-01 DIAGNOSIS — R319 Hematuria, unspecified: Secondary | ICD-10-CM | POA: Diagnosis not present

## 2021-07-01 DIAGNOSIS — Z0000 Encounter for general adult medical examination without abnormal findings: Secondary | ICD-10-CM | POA: Diagnosis not present

## 2021-07-01 DIAGNOSIS — E782 Mixed hyperlipidemia: Secondary | ICD-10-CM | POA: Diagnosis not present

## 2021-07-19 ENCOUNTER — Encounter: Payer: Self-pay | Admitting: *Deleted

## 2021-07-27 ENCOUNTER — Ambulatory Visit (INDEPENDENT_AMBULATORY_CARE_PROVIDER_SITE_OTHER): Payer: Medicare HMO | Admitting: Urology

## 2021-07-27 ENCOUNTER — Other Ambulatory Visit: Payer: Self-pay

## 2021-07-27 ENCOUNTER — Encounter: Payer: Self-pay | Admitting: Urology

## 2021-07-27 VITALS — BP 123/78 | HR 103 | Wt 235.0 lb

## 2021-07-27 DIAGNOSIS — N401 Enlarged prostate with lower urinary tract symptoms: Secondary | ICD-10-CM | POA: Diagnosis not present

## 2021-07-27 DIAGNOSIS — R972 Elevated prostate specific antigen [PSA]: Secondary | ICD-10-CM | POA: Diagnosis not present

## 2021-07-27 DIAGNOSIS — R3129 Other microscopic hematuria: Secondary | ICD-10-CM | POA: Diagnosis not present

## 2021-07-27 DIAGNOSIS — N138 Other obstructive and reflux uropathy: Secondary | ICD-10-CM

## 2021-07-27 LAB — URINALYSIS, ROUTINE W REFLEX MICROSCOPIC
Bilirubin, UA: NEGATIVE
Glucose, UA: NEGATIVE
Ketones, UA: NEGATIVE
Nitrite, UA: NEGATIVE
Protein,UA: NEGATIVE
Specific Gravity, UA: 1.02 (ref 1.005–1.030)
Urobilinogen, Ur: 0.2 mg/dL (ref 0.2–1.0)
pH, UA: 6 (ref 5.0–7.5)

## 2021-07-27 LAB — MICROSCOPIC EXAMINATION: Renal Epithel, UA: NONE SEEN /hpf

## 2021-07-27 LAB — BLADDER SCAN AMB NON-IMAGING: Scan Result: 26

## 2021-07-27 NOTE — Progress Notes (Signed)
Assessment: 1. Elevated PSA   2. Microscopic hematuria   3. BPH with obstruction/lower urinary tract symptoms      Plan: Potential signficance of elevated PSA discussed with the patient and his wife including possible diagnosis of prostate cancer.  The need for further evaluation with prostate biopsy discussed.  Risks of procedure reviewed. Free and total PSA today - will call with results Diagnosis of microscopic hematuria discussed.  Need for further evaluation with CT and cystoscopy discussed. Will repeat U/A next visit and proceed with evaluation if abnormal Continue tamsulosin He is scheduled to see hematology later this week for thrombocytopenia   Chief Complaint:  Chief Complaint  Patient presents with   Elevated PSA     History of Present Illness:  Jamie Gonzalez is a 65 y.o. year old male who is seen in consultation from Altamese Cabal, PA-C for evaluation of elevated PSA.  His recent PSA from 07/01/21 was 14.8 Prior PSAs have been normal:  3.8 in 3/20, 3.2 in 1/19, and 0.67 from 7/11.  No prior prostate biopsy.  No history of UTI's or prostatitis.   He does have LUTS with frequency, urgency, intermittent stream, and hesitancy.  He was started on tamsulosin about 3 weeks ago with improvement in his LUTS.   He reports occasional dysuria.  No gross hematuria. History of tobacco use at 1/2 ppd x 50 years.    Past Medical History:  Diagnosis Date   Hypertension    Tubular adenoma      Past Surgical History:  Procedure Laterality Date   COLONOSCOPY  05/22/2009   Dr.Rourk- normal rectum, polyps at the splenic flexure and sigmoid, the remainder of the colonic mucosa appeared normal. bx= tubular adenoma   COLONOSCOPY N/A 08/19/2014   Procedure: COLONOSCOPY;  Surgeon: Daneil Dolin, MD;  Location: [facility];  Service: Endoscopy;  Laterality: N/A;  1130    Allergies:  No Known Allergies  Family History:  History reviewed. No pertinent family history.   Social  History   Tobacco Use   Smoking status: Every Day    Packs/day: 0.50    Types: Cigarettes   Tobacco comments:    1/2 pack daily    Review of symptoms:  Constitutional:  Negative for unexplained weight loss, night sweats, fever, chills ENT:  Negative for nose bleeds, sinus pain, painful swallowing CV:  Negative for chest pain, shortness of breath, exercise intolerance, palpitations, loss of consciousness Resp:  Negative for cough, wheezing, shortness of breath GI:  Negative for nausea, vomiting, diarrhea, bloody stools GU:  Positives noted in HPI; otherwise negative for gross hematuria, dysuria, urinary incontinence Neuro:  Negative for seizures, poor balance, limb weakness, slurred speech Psych:  Negative for lack of energy, depression, anxiety Endocrine:  Negative for polydipsia, polyuria, symptoms of hypoglycemia (dizziness, hunger, sweating) Hematologic:  Negative for anemia, purpura, petechia, prolonged or excessive bleeding, use of anticoagulants  Allergic:  Negative for difficulty breathing or choking as a result of exposure to anything; no shellfish allergy; no allergic response (rash/itch) to materials, foods  Physical exam: BP 123/78   Pulse (!) 103   Wt 235 lb (106.6 kg)   BMI 31.87 kg/m  GENERAL APPEARANCE:  Well appearing, well developed, well nourished, NAD HEENT: Atraumatic, Normocephalic, oropharynx clear. NECK: Supple without lymphadenopathy or thyromegaly. LUNGS: Clear to auscultation bilaterally. HEART: Regular Rate and Rhythm without murmurs, gallops, or rubs. ABDOMEN: Soft, non-tender, No Masses. EXTREMITIES: Moves all extremities well.  Without clubbing, cyanosis, or edema. NEUROLOGIC:  Alert and oriented x 3, normal gait, CN II-XII grossly intact.  MENTAL STATUS:  Appropriate. BACK:  Non-tender to palpation.  No CVAT SKIN:  Warm, dry and intact.   GU: Penis:  uncircumcised Meatus: Normal Scrotum: normal, no masses Testis: normal without masses   Epididymis: normal Prostate: 40 g, NT, no nodules Rectum: Normal tone, normal prostate, no masses or tenderness   Results: Results for orders placed or performed in visit on 07/27/21 (from the past 24 hour(s))  Microscopic Examination   Collection Time: 07/27/21  9:07 AM   Urine  Result Value Ref Range   WBC, UA 0-5 0 - 5 /hpf   RBC 3-10 (A) 0 - 2 /hpf   Epithelial Cells (non renal) 0-10 0 - 10 /hpf   Renal Epithel, UA None seen None seen /hpf   Mucus, UA Present Not Estab.   Bacteria, UA Few None seen/Few  Urinalysis, Routine w reflex microscopic   Collection Time: 07/27/21  9:07 AM  Result Value Ref Range   Specific Gravity, UA 1.020 1.005 - 1.030   pH, UA 6.0 5.0 - 7.5   Color, UA Yellow Yellow   Appearance Ur Clear Clear   Leukocytes,UA Trace (A) Negative   Protein,UA Negative Negative/Trace   Glucose, UA Negative Negative   Ketones, UA Negative Negative   RBC, UA 1+ (A) Negative   Bilirubin, UA Negative Negative   Urobilinogen, Ur 0.2 0.2 - 1.0 mg/dL   Nitrite, UA Negative Negative   Microscopic Examination See below:   BLADDER SCAN AMB NON-IMAGING   Collection Time: 07/27/21  9:13 AM  Result Value Ref Range   Scan Result 26

## 2021-07-27 NOTE — Progress Notes (Signed)
Urological Symptom Review  Patient is experiencing the following symptoms: Frequent urination Hard to postpone urination Burning/pain with urination Get up at night to urinate Stream starts and stops Trouble starting stream   Review of Systems  Gastrointestinal (upper)  : Negative for upper GI symptoms  Gastrointestinal (lower) : Negative for lower GI symptoms  Constitutional : Negative for symptoms  Skin: Negative for skin symptoms  Eyes: Negative for eye symptoms  Ear/Nose/Throat : Negative for Ear/Nose/Throat symptoms  Hematologic/Lymphatic: Negative for Hematologic/Lymphatic symptoms  Cardiovascular : Negative for cardiovascular symptoms  Respiratory : Cough  Endocrine: Negative for endocrine symptoms  Musculoskeletal: Negative for musculoskeletal symptoms  Neurological: Negative for neurological symptoms  Psychologic: Negative for psychiatric symptoms

## 2021-07-28 LAB — PSA, TOTAL AND FREE
PSA, Free Pct: 16.6 %
PSA, Free: 2.42 ng/mL
Prostate Specific Ag, Serum: 14.6 ng/mL — ABNORMAL HIGH (ref 0.0–4.0)

## 2021-07-30 DIAGNOSIS — D696 Thrombocytopenia, unspecified: Secondary | ICD-10-CM | POA: Diagnosis not present

## 2021-08-17 NOTE — Progress Notes (Signed)
Assessment: 1. Elevated PSA   2. BPH with obstruction/lower urinary tract symptoms     Plan: Today I had a long discussion with the patient regarding PSA and the rationale and controversies of prostate cancer early detection.  I discussed the pros and cons of further evaluation including TRUS and prostate Bx.  Potential adverse events and complications as well as standard instructions were given.  Patient expressed his understanding of these issues. Schedule for TRUS/BX Continue tamsulosin   Chief Complaint:  Chief Complaint  Patient presents with   Elevated PSA   Hematuria   Benign Prostatic Hypertrophy    History of Present Illness:  Jamie Gonzalez is a 65 y.o. year old male who is seen for further evaluation of elevated PSA.  His recent PSA from 07/01/21 was 14.8 Prior PSAs have been normal:  3.8 in 3/20, 3.2 in 1/19, and 0.67 from 7/11.  No prior prostate biopsy.  No history of UTI's or prostatitis.   PSA from 9/22 was 14.6 with 16.6% free.  He does have LUTS with frequency, urgency, intermittent stream, and hesitancy.  He was started on tamsulosin about 3 weeks ago with improvement in his LUTS.   History of tobacco use at 1/2 ppd x 50 years.  U/A from 07/27/21:  3-10 RBCs.  He has been seen by hematology for thrombocytopenia.  His recent platelet count was 149,000.  He has been cleared for prostate biopsy from their standpoint.  His LUTS are stable on tamsulosin.  No dysuria or gross hematuria.  Portions of the above documentation were copied from a prior visit for review purposes only.   Past Medical History:  Diagnosis Date   Hypertension    Tubular adenoma      Past Surgical History:  Procedure Laterality Date   COLONOSCOPY  05/22/2009   Dr.Rourk- normal rectum, polyps at the splenic flexure and sigmoid, the remainder of the colonic mucosa appeared normal. bx= tubular adenoma   COLONOSCOPY N/A 08/19/2014   Procedure: COLONOSCOPY;  Surgeon: Daneil Dolin, MD;   Location: [facility];  Service: Endoscopy;  Laterality: N/A;  1130    Allergies:  No Known Allergies  Family History:  No family history on file.   Social History   Tobacco Use   Smoking status: Every Day    Packs/day: 0.50    Types: Cigarettes   Tobacco comments:    1/2 pack daily   ROS: Constitutional:  Negative for fever, chills, weight loss CV: Negative for chest pain, previous MI, hypertension Respiratory:  Negative for shortness of breath, wheezing, sleep apnea, frequent cough GI:  Negative for nausea, vomiting, bloody stool, GERD  Physical exam: BP 125/82   Pulse 84   Temp 98.6 F (37 C)   Wt 238 lb 9.6 oz (108.2 kg)   BMI 32.36 kg/m  GENERAL APPEARANCE:  Well appearing, well developed, well nourished, NAD HEENT:  Atraumatic, normocephalic, oropharynx clear NECK:  Supple without lymphadenopathy or thyromegaly ABDOMEN:  Soft, non-tender, no masses EXTREMITIES:  Moves all extremities well, without clubbing, cyanosis, or edema NEUROLOGIC:  Alert and oriented x 3, normal gait, CN II-XII grossly intact MENTAL STATUS:  appropriate BACK:  Non-tender to palpation, No CVAT SKIN:  Warm, dry, and intact   Results: U/A:  negative

## 2021-08-18 ENCOUNTER — Other Ambulatory Visit: Payer: Self-pay

## 2021-08-18 ENCOUNTER — Encounter: Payer: Self-pay | Admitting: Urology

## 2021-08-18 ENCOUNTER — Ambulatory Visit (INDEPENDENT_AMBULATORY_CARE_PROVIDER_SITE_OTHER): Payer: Medicare HMO | Admitting: Urology

## 2021-08-18 VITALS — BP 125/82 | HR 84 | Temp 98.6°F | Wt 238.6 lb

## 2021-08-18 DIAGNOSIS — N401 Enlarged prostate with lower urinary tract symptoms: Secondary | ICD-10-CM

## 2021-08-18 DIAGNOSIS — N138 Other obstructive and reflux uropathy: Secondary | ICD-10-CM | POA: Diagnosis not present

## 2021-08-18 DIAGNOSIS — R3129 Other microscopic hematuria: Secondary | ICD-10-CM

## 2021-08-18 DIAGNOSIS — R972 Elevated prostate specific antigen [PSA]: Secondary | ICD-10-CM

## 2021-08-18 LAB — URINALYSIS, ROUTINE W REFLEX MICROSCOPIC
Bilirubin, UA: NEGATIVE
Glucose, UA: NEGATIVE
Ketones, UA: NEGATIVE
Leukocytes,UA: NEGATIVE
Nitrite, UA: NEGATIVE
Protein,UA: NEGATIVE
Specific Gravity, UA: 1.02 (ref 1.005–1.030)
Urobilinogen, Ur: 0.2 mg/dL (ref 0.2–1.0)
pH, UA: 5.5 (ref 5.0–7.5)

## 2021-08-18 LAB — MICROSCOPIC EXAMINATION
Bacteria, UA: NONE SEEN
Epithelial Cells (non renal): NONE SEEN /hpf (ref 0–10)
RBC, Urine: NONE SEEN /hpf (ref 0–2)
Renal Epithel, UA: NONE SEEN /hpf
WBC, UA: NONE SEEN /hpf (ref 0–5)

## 2021-08-18 NOTE — Progress Notes (Signed)
Urological Symptom Review  Patient is experiencing the following symptoms: Frequent urination Burning/pain with urination Get up at night to urinate   Review of Systems  Gastrointestinal (upper)  : Negative for upper GI symptoms  Gastrointestinal (lower) : Negative for lower GI symptoms  Constitutional : Negative for symptoms  Skin: Negative for skin symptoms  Eyes: Negative for eye symptoms  Ear/Nose/Throat : Negative for Ear/Nose/Throat symptoms  Hematologic/Lymphatic: Negative for Hematologic/Lymphatic symptoms  Cardiovascular : Negative for cardiovascular symptoms  Respiratory : Negative for respiratory symptoms  Endocrine: Negative for endocrine symptoms  Musculoskeletal: Negative for musculoskeletal symptoms  Neurological: Negative for neurological symptoms  Psychologic: Negative for psychiatric symptoms  

## 2021-09-08 DIAGNOSIS — Z862 Personal history of diseases of the blood and blood-forming organs and certain disorders involving the immune mechanism: Secondary | ICD-10-CM | POA: Diagnosis not present

## 2021-09-08 DIAGNOSIS — D696 Thrombocytopenia, unspecified: Secondary | ICD-10-CM | POA: Diagnosis not present

## 2021-09-08 DIAGNOSIS — Z1159 Encounter for screening for other viral diseases: Secondary | ICD-10-CM | POA: Diagnosis not present

## 2021-09-08 DIAGNOSIS — R972 Elevated prostate specific antigen [PSA]: Secondary | ICD-10-CM

## 2021-09-08 NOTE — Addendum Note (Signed)
Addended by: Dorisann Frames on: 09/08/2021 03:39 PM   Modules accepted: Orders

## 2021-09-08 NOTE — Progress Notes (Signed)
Biopsy instructions reviewed with patient over the phone and sent via my chart as well. Patient voiced understanding.

## 2021-09-16 ENCOUNTER — Ambulatory Visit (HOSPITAL_COMMUNITY)
Admission: RE | Admit: 2021-09-16 | Discharge: 2021-09-16 | Disposition: A | Discharge status: Home / Self Care | Payer: Medicare HMO | Source: Ambulatory Visit | Attending: Urology | Admitting: Urology

## 2021-09-16 ENCOUNTER — Encounter (HOSPITAL_COMMUNITY): Payer: Self-pay

## 2021-09-16 ENCOUNTER — Ambulatory Visit (INDEPENDENT_AMBULATORY_CARE_PROVIDER_SITE_OTHER): Payer: Medicare HMO | Admitting: Urology

## 2021-09-16 ENCOUNTER — Encounter: Payer: Self-pay | Admitting: Urology

## 2021-09-16 ENCOUNTER — Other Ambulatory Visit: Payer: Self-pay

## 2021-09-16 ENCOUNTER — Other Ambulatory Visit: Payer: Self-pay | Admitting: Urology

## 2021-09-16 DIAGNOSIS — R972 Elevated prostate specific antigen [PSA]: Secondary | ICD-10-CM

## 2021-09-16 DIAGNOSIS — N138 Other obstructive and reflux uropathy: Secondary | ICD-10-CM

## 2021-09-16 DIAGNOSIS — C61 Malignant neoplasm of prostate: Secondary | ICD-10-CM | POA: Diagnosis not present

## 2021-09-16 MED ORDER — LIDOCAINE HCL (PF) 2 % IJ SOLN
INTRAMUSCULAR | Status: AC
  Administered 2021-09-16: 10 mL
  Filled 2021-09-16: qty 10

## 2021-09-16 MED ORDER — CEFTRIAXONE SODIUM 1 G IJ SOLR: 1.0000 g | Freq: Once | INTRAMUSCULAR | Status: AC

## 2021-09-16 MED ORDER — LIDOCAINE HCL (PF) 2 % IJ SOLN: 10.0000 mL | Freq: Once | INTRAMUSCULAR | Status: AC

## 2021-09-16 MED ORDER — LIDOCAINE HCL (PF) 1 % IJ SOLN: 2.1000 mL | Freq: Once | INTRAMUSCULAR | Status: AC

## 2021-09-16 MED ORDER — CEFTRIAXONE SODIUM 1 G IJ SOLR
INTRAMUSCULAR | Status: AC
  Administered 2021-09-16: 1 g via INTRAMUSCULAR
  Filled 2021-09-16: qty 10

## 2021-09-16 MED ORDER — LIDOCAINE HCL (PF) 1 % IJ SOLN
INTRAMUSCULAR | Status: AC
  Administered 2021-09-16: 2.1 mL
  Filled 2021-09-16: qty 5

## 2021-09-16 NOTE — Progress Notes (Signed)
PT tolerated prostate biopsy procedure well today. Labs obtained and sent for pathology. PT ambulatory at discharge with no acute distress noted and verbalized understanding of discharge instructions. PT to follow up with urologist as scheduled on 09/23/21 at 4:00pm.

## 2021-09-16 NOTE — Progress Notes (Signed)
Assessment: 1. Elevated PSA   2. BPH with obstruction/lower urinary tract symptoms      Plan: Post biopsy instructions given Return to office in 1 week for biopsy results  Chief Complaint:  Chief Complaint  Patient presents with   Elevated PSA    History of Present Illness:  Jamie Gonzalez is a 65 y.o. year old male who is seen for further evaluation of elevated PSA.  His recent PSA from 07/01/21 was 14.8 Prior PSAs have been normal:  3.8 in 3/20, 3.2 in 1/19, and 0.67 from 7/11.  No prior prostate biopsy.  No history of UTI's or prostatitis.   PSA from 9/22 was 14.6 with 16.6% free.  He does have LUTS with frequency, urgency, intermittent stream, and hesitancy.  He was started on tamsulosin about 3 weeks ago with improvement in his LUTS.   History of tobacco use at 1/2 ppd x 50 years.  U/A from 07/27/21:  3-10 RBCs. U/A from 08/18/21:  no RBCs  He has been seen by hematology for thrombocytopenia.  His recent platelet count was 149,000.  He has been cleared for prostate biopsy from their standpoint.  His LUTS are stable on tamsulosin.  No dysuria or gross hematuria.  He presents today for a prostate biopsy.  Portions of the above documentation were copied from a prior visit for review purposes only.   Past Medical History:  Diagnosis Date   Hypertension    Tubular adenoma      Past Surgical History:  Procedure Laterality Date   COLONOSCOPY  05/22/2009   Dr.Rourk- normal rectum, polyps at the splenic flexure and sigmoid, the remainder of the colonic mucosa appeared normal. bx= tubular adenoma   COLONOSCOPY N/A 08/19/2014   Procedure: COLONOSCOPY;  Surgeon: Daneil Dolin, MD;  Location: [facility];  Service: Endoscopy;  Laterality: N/A;  1130    Allergies:  No Known Allergies  Family History:  No family history on file.   Social History   Tobacco Use   Smoking status: Every Day    Packs/day: 0.50    Types: Cigarettes   Smokeless tobacco: Never    Tobacco comments:    1/2 pack daily  Vaping Use   Vaping Use: Never used  Substance Use Topics   Alcohol use: Not Currently   Drug use: Never   ROS: Constitutional:  Negative for fever, chills, weight loss CV: Negative for chest pain, previous MI, hypertension Respiratory:  Negative for shortness of breath, wheezing, sleep apnea, frequent cough GI:  Negative for nausea, vomiting, bloody stool, GERD  Physical exam: GENERAL APPEARANCE:  Well appearing, well developed, well nourished, NAD HEENT:  Atraumatic, normocephalic, oropharynx clear NECK:  Supple without lymphadenopathy or thyromegaly ABDOMEN:  Soft, non-tender, no masses EXTREMITIES:  Moves all extremities well, without clubbing, cyanosis, or edema NEUROLOGIC:  Alert and oriented x 3, normal gait, CN II-XII grossly intact MENTAL STATUS:  appropriate BACK:  Non-tender to palpation, No CVAT SKIN:  Warm, dry, and intact  Results: None  TRANSRECTAL ULTRASOUND AND PROSTATE BIOPSY  Indication:  Elevated PSA  Prophylactic antibiotic administration: Rocephin  All medications that could result in increased bleeding were discontinued within an appropriate period of the time of biopsy.  Risk including bleeding and infection were discussed.  Informed consent was obtained.  The patient was placed in the left lateral decubitus position.  PROCEDURE 1.  TRANSRECTAL ULTRASOUND OF THE PROSTATE  The 7 MHz transrectal probe was used to image the prostate.  Anal stenosis  was not noted.  TRUS volume: 123.86 ml  Hypoechoic areas: None  Hyperechoic areas: Left and base  Central calcifications: present  Margins:  normal  Large median lobe noted projecting into bladder.  PROCEDURE 2:  PROSTATE BIOPSY  A periprostatic block was performed using 1% lidocaine and transrectal ultrasound guidance. Under transrectal ultrasound guidance, and using the Biopty gun, prostate biopsies were obtained systematically from the apex, mid gland, and  base bilaterally.  A total of 12 cores were obtained.  Hemostasis was obtained with gentle pressure on the prostate.  The procedures were well-tolerated.  No significant bleeding was noted at the end of the procedure.  The patient was stable for discharge from the office.

## 2021-09-23 ENCOUNTER — Other Ambulatory Visit: Payer: Self-pay

## 2021-09-23 ENCOUNTER — Ambulatory Visit: Payer: Medicare HMO | Admitting: Urology

## 2021-09-23 ENCOUNTER — Encounter: Payer: Self-pay | Admitting: Urology

## 2021-09-23 DIAGNOSIS — N401 Enlarged prostate with lower urinary tract symptoms: Secondary | ICD-10-CM | POA: Diagnosis not present

## 2021-09-23 DIAGNOSIS — N138 Other obstructive and reflux uropathy: Secondary | ICD-10-CM | POA: Diagnosis not present

## 2021-09-23 DIAGNOSIS — C61 Malignant neoplasm of prostate: Secondary | ICD-10-CM

## 2021-09-23 NOTE — Progress Notes (Signed)
Assessment: 1. Prostate cancer [facility]); high risk; Gleason group 3,4; PSA 14.8   2. BPH with obstruction/lower urinary tract symptoms     Plan: I spent a total of 60 minutes counseling Jamie Gonzalez and his wife regarding the diagnosis of localized prostate cancer.  I spent the first 15 minutes discussing the biopsy results.  Using the [facility] prostate cancer nomogram, I sided a probability of 39 percent for localized prostate cancer, 60 percent of extracapsular extension, 12 percent of seminal vesicle involvement, and 16 percent of lymph node involvement.  I discussed the diagnosis of localized prostate cancer in the natural history of prostate cancer. I then spent the next 45 minutes discussing treatment options for localized prostate cancer.  Specifically, I discussed active surveillance, radical prostatectomy (RALP, RRP, RPP), external beam radiation, low dose rate brachytherapy, cryosurgery, high intensity frequency ultrasound, and androgen deprivation therapy.  I discussed the risk and benefits of each treatment.  I discussed the potential risk of impotence and incontinence as they relate to treatment of prostate cancer.  Questions were answered.  The patient was given literature regarding prostate cancer to review.  Given his high risk disease, I have recommended further evaluation with a bone scan.  I also discussed further evaluation for possible extracapsular extension with a prostate MRI.  I advised him that we would need to wait approximately 4-6 weeks before proceeding with the MRI to prevent difficulty with interpretation secondary to changes from his biopsy.  Chief Complaint:  Chief Complaint  Patient presents with   Prostate Cancer    History of Present Illness:  Jamie Gonzalez is a 65 y.o. year old male who is seen for further evaluation of elevated PSA.  His most recent PSA from 07/01/21 was 14.6.  Prior PSAs have been normal:  3.8 in 3/20, 3.2 in 1/19, and 0.67 from 7/11.  No  prior prostate biopsy.  No history of UTI's or prostatitis.   PSA from 9/22 was 14.6 with 16.6% free.  He returns today following his transrectal ultrasound and biopsy of the prostate. PSA: 14.6 ng/ml TRUS volume:  123.86 ml  PSA density:  0.11 Biopsy results:             Gleason score: 4 + 4 = 8, 4 + 3 = 7            # positive cores: 2/6 on right    0/6 on left            Location of cancer: apex  Complications after biopsy: none   He does have LUTS with frequency, urgency, intermittent stream, and hesitancy.  He was started on tamsulosin about 3 weeks ago with improvement in his LUTS.   History of tobacco use at 1/2 ppd x 50 years.  U/A from 07/27/21:  3-10 RBCs. U/A from 08/18/21:  no RBCs  His LUTS are stable on tamsulosin.  No dysuria or gross hematuria.  Portions of the above documentation were copied from a prior visit for review purposes only.   Past Medical History:  Diagnosis Date   Hypertension    Tubular adenoma      Past Surgical History:  Procedure Laterality Date   COLONOSCOPY  05/22/2009   Dr.Rourk- normal rectum, polyps at the splenic flexure and sigmoid, the remainder of the colonic mucosa appeared normal. bx= tubular adenoma   COLONOSCOPY N/A 08/19/2014   Procedure: COLONOSCOPY;  Surgeon: Daneil Dolin, MD;  Location: [facility];  Service: Endoscopy;  Laterality:  N/A;  1130    Allergies:  No Known Allergies  Family History:  History reviewed. No pertinent family history.   Social History   Tobacco Use   Smoking status: Every Day    Packs/day: 0.50    Types: Cigarettes   Smokeless tobacco: Never   Tobacco comments:    1/2 pack daily  Vaping Use   Vaping Use: Never used  Substance Use Topics   Alcohol use: Not Currently   Drug use: Never   ROS: Constitutional:  Negative for fever, chills, weight loss CV: Negative for chest pain, previous MI, hypertension Respiratory:  Negative for shortness of breath, wheezing, sleep apnea, frequent  cough GI:  Negative for nausea, vomiting, bloody stool, GERD  Physical exam: GENERAL APPEARANCE:  Well appearing, well developed, well nourished, NAD HEENT:  Atraumatic, normocephalic, oropharynx clear NECK:  Supple without lymphadenopathy or thyromegaly ABDOMEN:  Soft, non-tender, no masses EXTREMITIES:  Moves all extremities well, without clubbing, cyanosis, or edema NEUROLOGIC:  Alert and oriented x 3, normal gait, CN II-XII grossly intact MENTAL STATUS:  appropriate BACK:  Non-tender to palpation, No CVAT SKIN:  Warm, dry, and intact  Results: None

## 2021-10-26 ENCOUNTER — Other Ambulatory Visit: Payer: Self-pay

## 2021-10-26 ENCOUNTER — Encounter (HOSPITAL_COMMUNITY)
Admission: RE | Admit: 2021-10-26 | Discharge: 2021-10-26 | Disposition: A | Discharge status: Home / Self Care | Payer: Medicare HMO | Source: Ambulatory Visit | Attending: Urology | Admitting: Urology

## 2021-10-26 DIAGNOSIS — C61 Malignant neoplasm of prostate: Secondary | ICD-10-CM | POA: Diagnosis not present

## 2021-10-26 IMAGING — NM NM BONE WHOLE BODY
2 series · 2 of 2 positions shown · non-contrast
Comparison: Cardiac CT [DATE].  Chest x-ray [DATE].

CLINICAL DATA: Prostate cancer.

EXAM:
NUCLEAR MEDICINE WHOLE BODY BONE SCAN
TECHNIQUE: Whole body anterior and posterior images were obtained approximately
3 hours after intravenous injection of radiopharmaceutical.
RADIOPHARMACEUTICALS:  20 mCi [[place]] MDP IV

[Series 1: whole body · 2.66mm/px · 1 of 1 slices shown (1 of 2)]
[im 1/1]
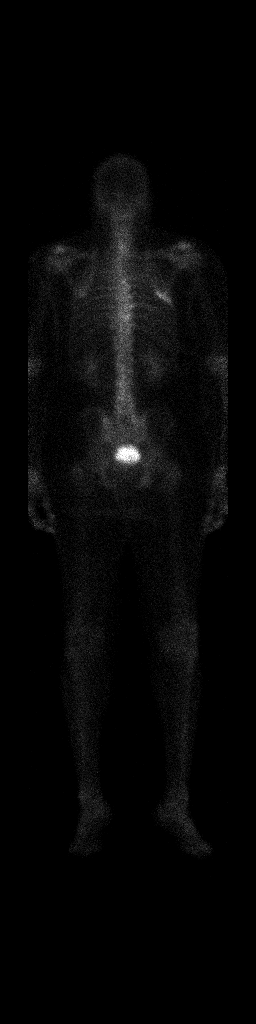

[Series 1: whole body · 2.66mm/px · 1 of 1 slices shown (2 of 2)]
[im 1/1]
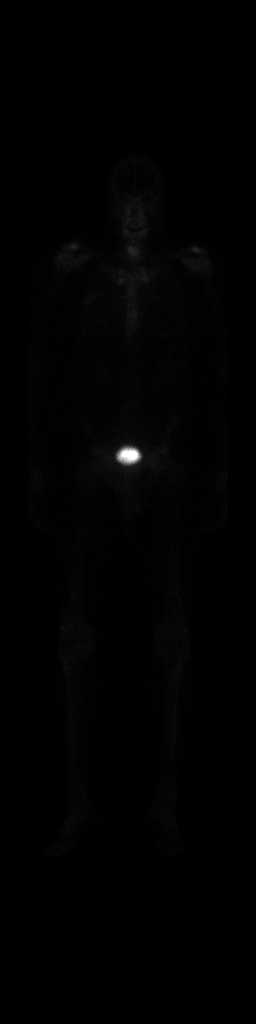

[2 of 2 positions shown; findings below may reference images not displayed]

FINDINGS: Increased activity noted over both shoulders and both
sternoclavicular joints, most likely degenerative. Punctate area of
increased activity over the lower sternum cannot be excluded.
Sternal series can be obtained for further evaluation. Multifocal
areas of increased activity noted over the lower cervical and
throughout the thoracic spine. Although these changes may be
degenerative, metastatic disease cannot be excluded. MRI of the
cervical and thoracic spine can be obtained for further evaluation.
Increased activity noted over a right mid posterior rib. Right rib
series suggested for further evaluation. Metastatic disease cannot
be excluded.
IMPRESSION: 1. Punctate area of increased activity noted over the lower sternum
cannot be excluded. Sternal series can be obtained for further
evaluation.

2. Multifocal areas of increased activity noted over the lower
cervical and throughout the thoracic spine. Although these changes
may be degenerative, metastatic disease cannot be excluded. MRI of
the cervical and thoracic spine can be obtained for further
evaluation.

3. Increased activity noted over a right mid posterior rib. Right
rib series suggested for further evaluation. Metastatic disease
cannot be excluded.

4. Increased activity noted over both shoulders and both
sternoclavicular joints, most likely degenerative.

## 2021-10-26 MED ORDER — TECHNETIUM TC 99M MEDRONATE IV KIT
20.0000 | PACK | Freq: Once | INTRAVENOUS | Status: AC | PRN
  Administered 2021-10-26: 10:00:00 20 via INTRAVENOUS

## 2021-11-04 ENCOUNTER — Other Ambulatory Visit: Payer: Self-pay | Admitting: Urology

## 2021-11-04 DIAGNOSIS — C61 Malignant neoplasm of prostate: Secondary | ICD-10-CM

## 2021-11-04 DIAGNOSIS — R948 Abnormal results of function studies of other organs and systems: Secondary | ICD-10-CM

## 2021-11-24 ENCOUNTER — Ambulatory Visit (HOSPITAL_COMMUNITY)
Admission: RE | Admit: 2021-11-24 | Discharge: 2021-11-24 | Disposition: A | Discharge status: Home / Self Care | Payer: Medicare HMO | Source: Ambulatory Visit | Attending: Urology | Admitting: Urology

## 2021-11-24 ENCOUNTER — Encounter (HOSPITAL_COMMUNITY): Payer: Self-pay

## 2021-11-24 ENCOUNTER — Other Ambulatory Visit: Payer: Self-pay

## 2021-11-24 ENCOUNTER — Other Ambulatory Visit: Payer: Self-pay | Admitting: Urology

## 2021-11-24 DIAGNOSIS — R948 Abnormal results of function studies of other organs and systems: Secondary | ICD-10-CM | POA: Diagnosis not present

## 2021-11-24 DIAGNOSIS — C61 Malignant neoplasm of prostate: Secondary | ICD-10-CM

## 2021-11-24 DIAGNOSIS — R937 Abnormal findings on diagnostic imaging of other parts of musculoskeletal system: Secondary | ICD-10-CM | POA: Diagnosis not present

## 2021-11-24 IMAGING — DX DG RIBS 2V*R*
4 series · 4 of 4 positions shown · non-contrast
Comparison: Bone scan done on [DATE]

CLINICAL DATA: Abnormal bone scan

EXAM:
RIGHT RIBS - 2 VIEW

[rib pa (1 of 2)]
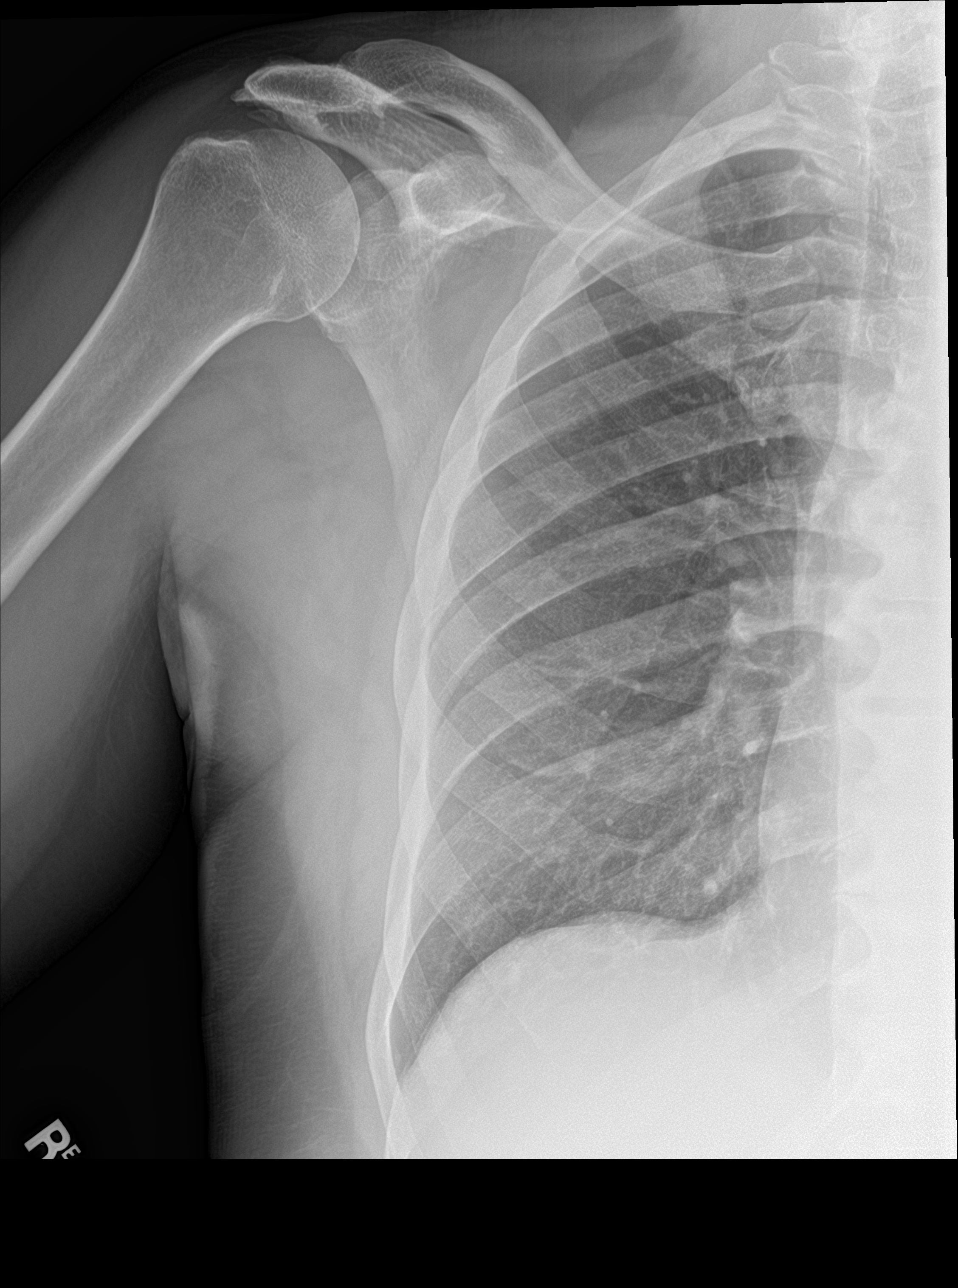

[rib pa (2 of 2)]
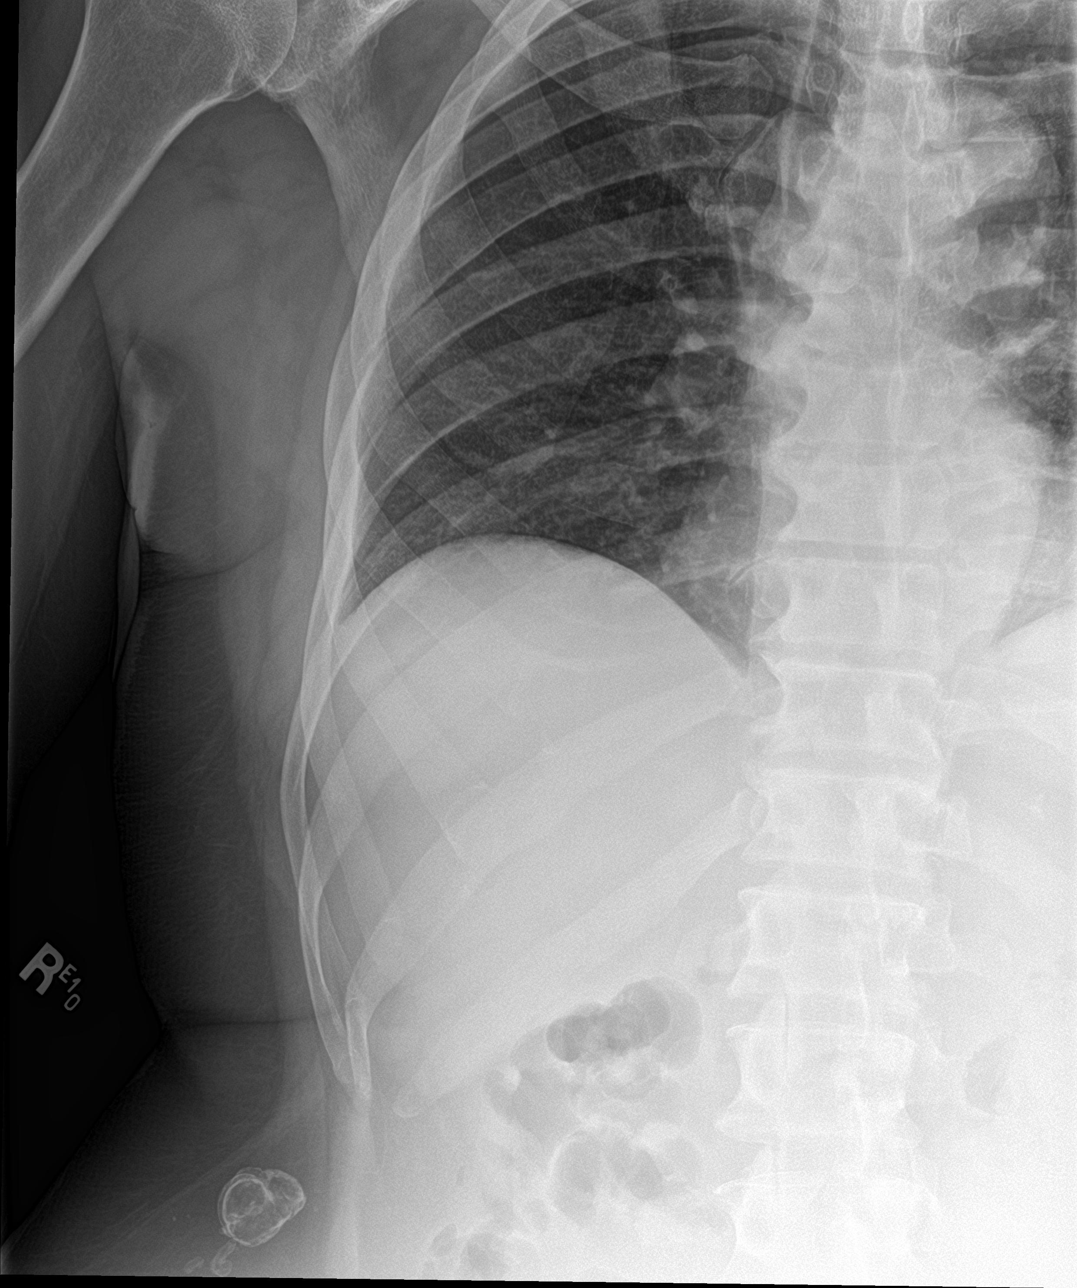

[rib pa obl (1 of 2)]
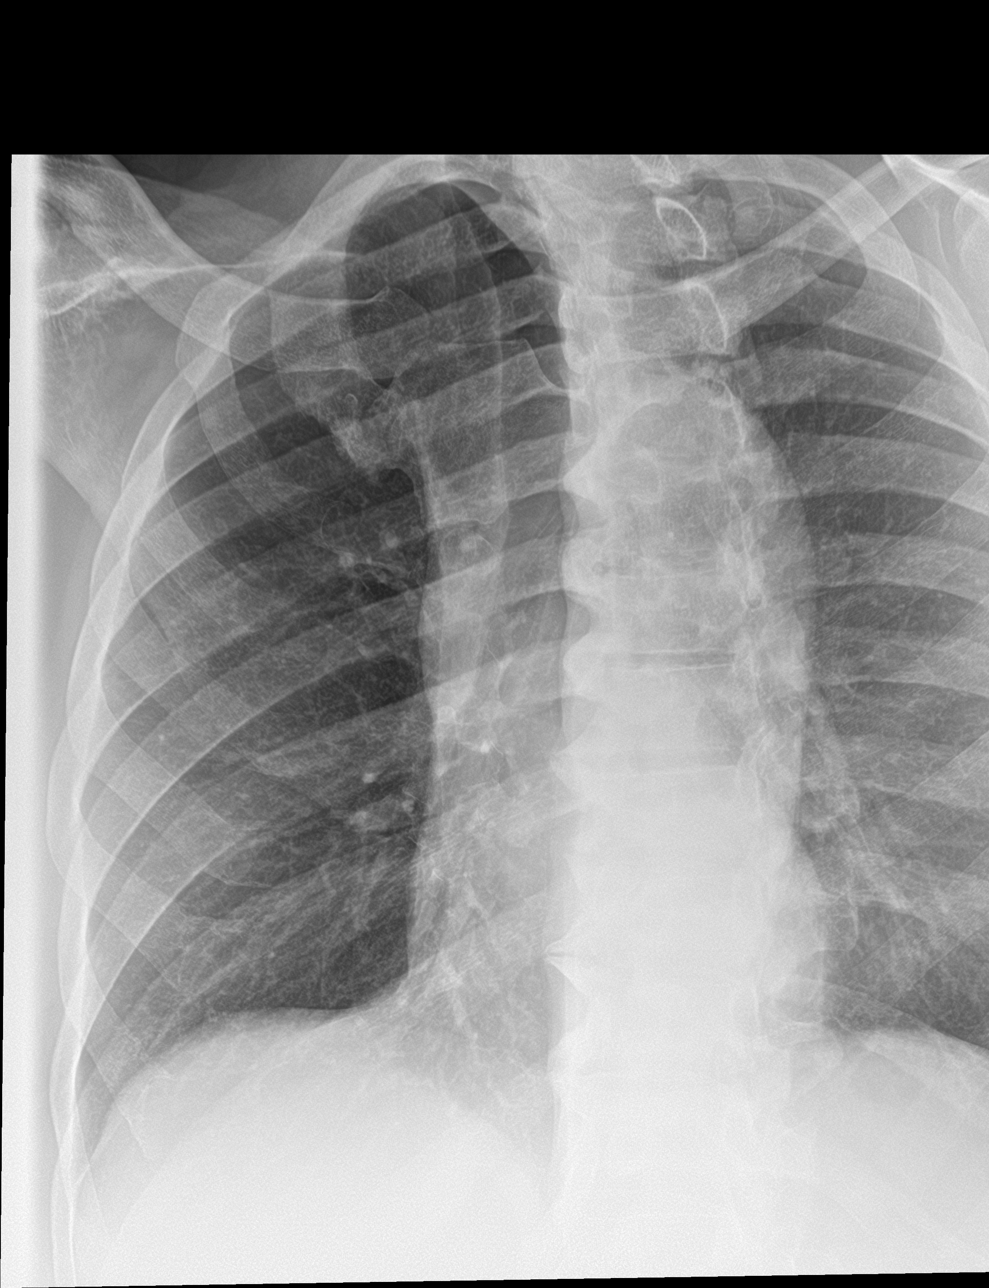

[rib pa obl (2 of 2)]
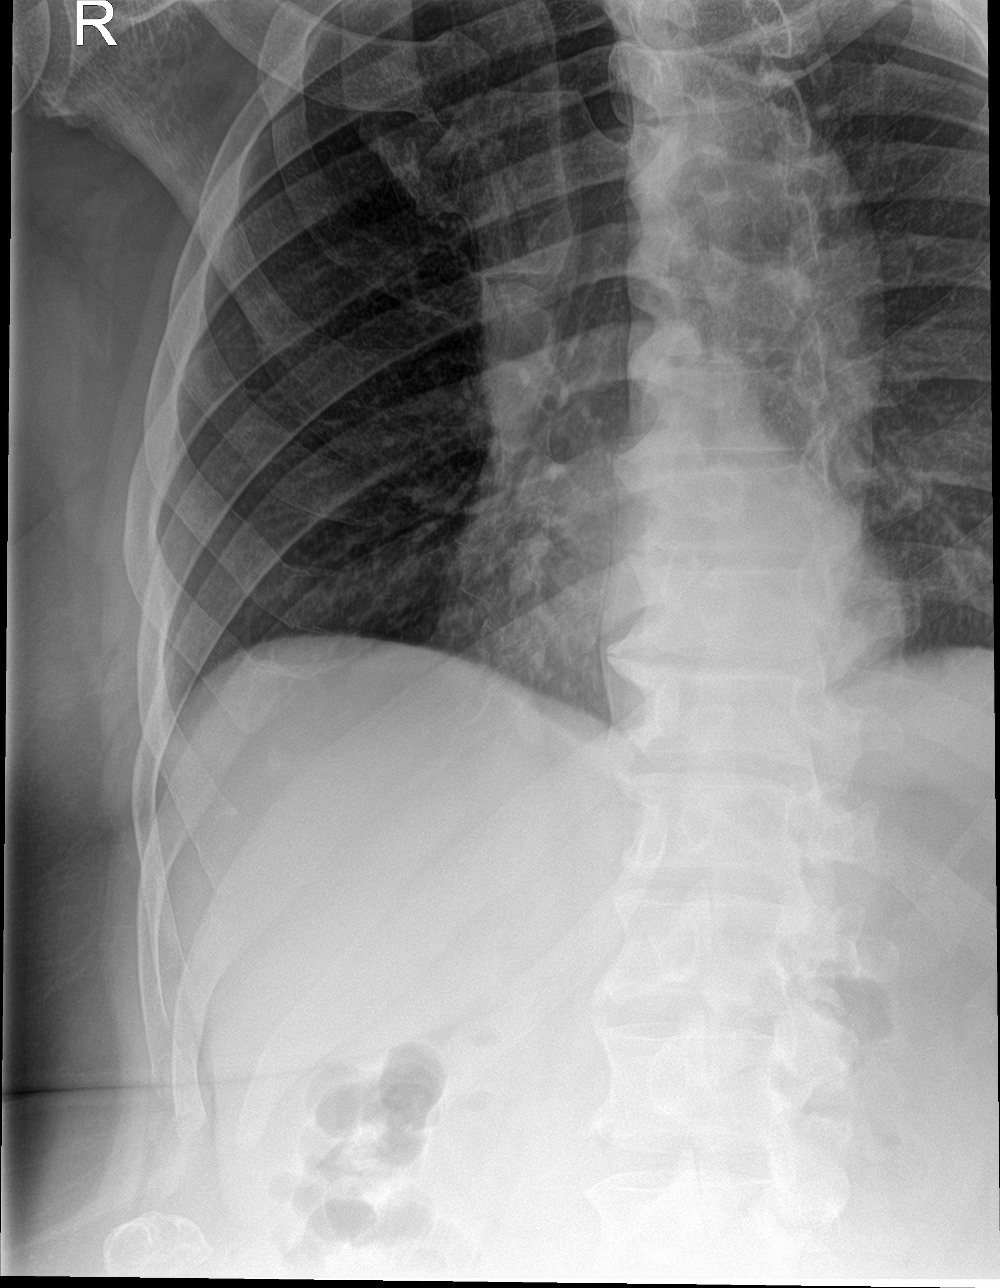

[4 of 4 positions shown; findings below may reference images not displayed]

FINDINGS: No recent fracture is seen. There are no sclerotic lesions. There is
mild expansion in the posterolateral aspect of right sixth rib
without break in the cortical margins. This finding corresponds to
linear focus of increased uptake seen in the bone scan.
IMPRESSION: There is mild cortical expansion in the posterolateral aspect of
right sixth rib without break in the cortical margins. Possibility
of inflammatory or neoplastic process in the right sixth rib should
be considered.

## 2021-11-24 IMAGING — DX DG STERNUM 2+V
3 series · 3 of 3 positions shown · non-contrast
Comparison: Bone scan done on [DATE]

CLINICAL DATA: Abnormal bone scan, prostate carcinoma

EXAM:
STERNUM - 2+ VIEW

[chest pa]
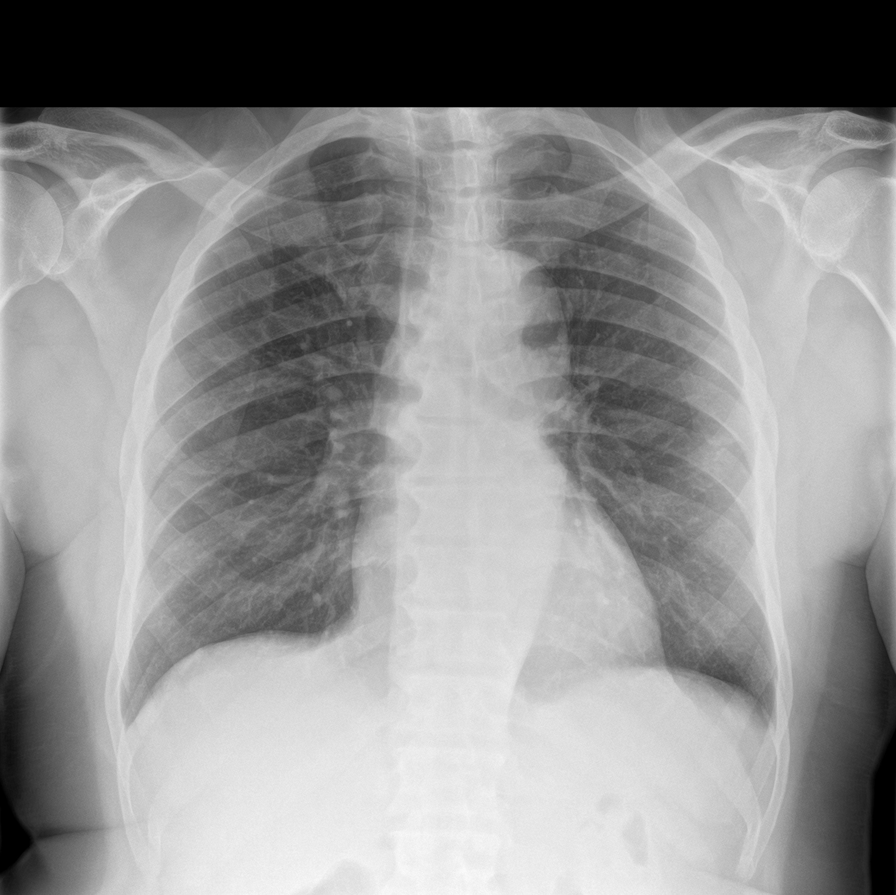

[sternum lat (1 of 2)]
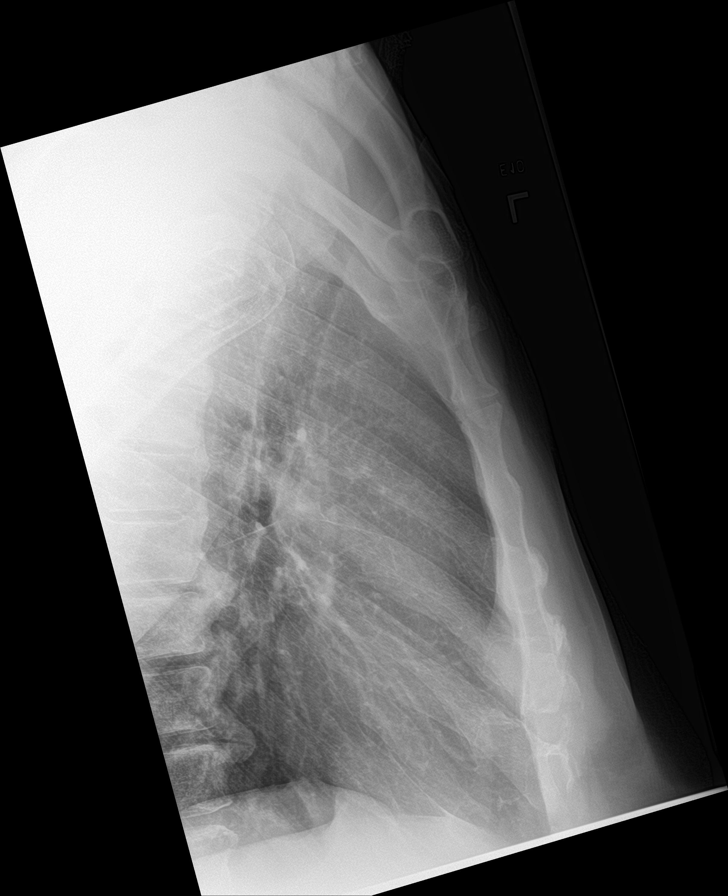

[sternum lat (2 of 2)]
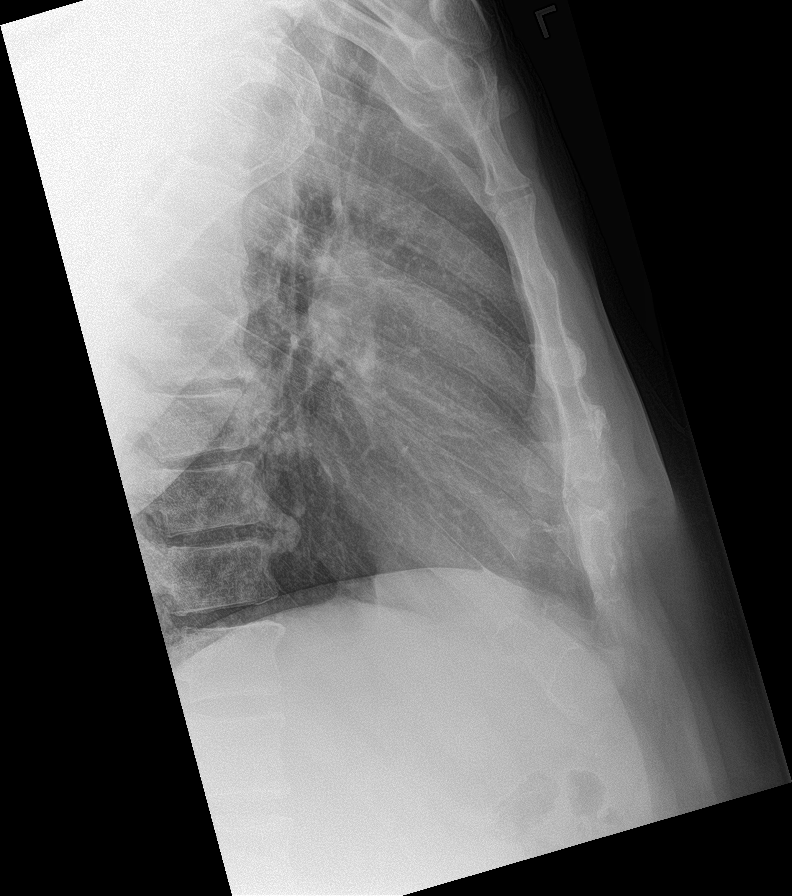

[3 of 3 positions shown; findings below may reference images not displayed]

FINDINGS: There was focal uptake in the lower body of sternum in the bone
scan. There are no focal lytic or sclerotic lesions in the sternum.
IMPRESSION: No focal radiographic abnormality is seen in the sternum.

## 2021-11-25 ENCOUNTER — Ambulatory Visit (HOSPITAL_COMMUNITY)
Admission: RE | Admit: 2021-11-25 | Discharge: 2021-11-25 | Disposition: A | Discharge status: Home / Self Care | Payer: Medicare HMO | Source: Ambulatory Visit | Attending: Urology | Admitting: Urology

## 2021-11-25 ENCOUNTER — Ambulatory Visit (HOSPITAL_COMMUNITY): Admission: RE | Admit: 2021-11-25 | Payer: Medicare HMO | Source: Ambulatory Visit

## 2021-11-25 DIAGNOSIS — R948 Abnormal results of function studies of other organs and systems: Secondary | ICD-10-CM

## 2021-11-25 DIAGNOSIS — M2578 Osteophyte, vertebrae: Secondary | ICD-10-CM | POA: Diagnosis not present

## 2021-11-25 DIAGNOSIS — C61 Malignant neoplasm of prostate: Secondary | ICD-10-CM | POA: Insufficient documentation

## 2021-11-25 DIAGNOSIS — M40204 Unspecified kyphosis, thoracic region: Secondary | ICD-10-CM | POA: Diagnosis not present

## 2021-11-25 IMAGING — MR MR CERVICAL SPINE WO/W CM
5 of 8 series · 26 of 48 positions shown · IV contrast (gadavist)
Comparison: Prior bone scan from [DATE].

CLINICAL DATA: Initial evaluation for prostate cancer, abnormal
bone scan.

EXAM:
MRI CERVICAL SPINE WITHOUT AND WITH CONTRAST
TECHNIQUE: Multiplanar and multiecho pulse sequences of the cervical spine, to
include the craniocervical junction and cervicothoracic junction,
were obtained without and with intravenous contrast.
CONTRAST:  10mL GADAVIST GADOBUTROL 1 MMOL/ML IV SOLN

[Series 16: T2 · sagittal · 3.0mm · 0.69mm/px · 4 of 15 slices shown (1 of 2)]
[im 1/15]
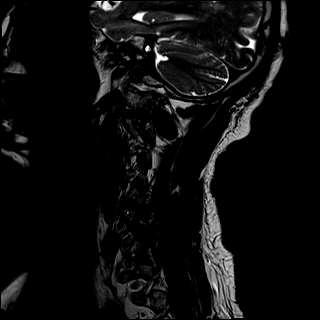
[im 5/15]
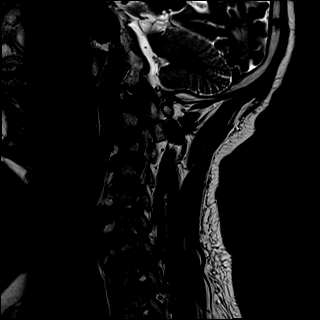
[im 10/15]
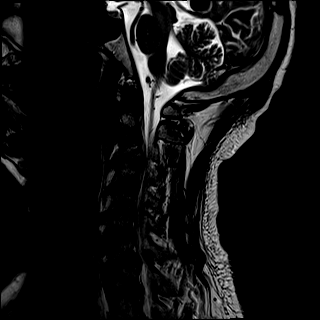
[im 15/15]
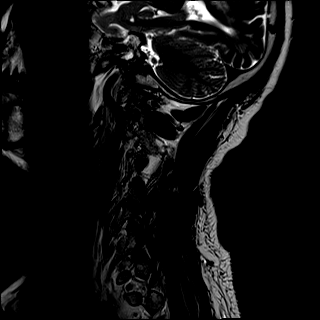

[Series 17: T1 · sagittal · 3.0mm · 0.69mm/px · 3 of 15 slices shown (1 of 2)]
[im 1/15]
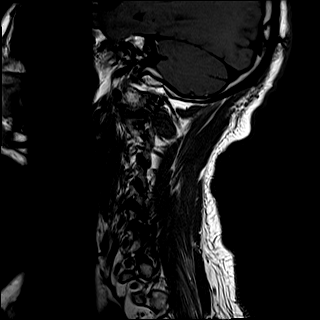
[im 8/15]
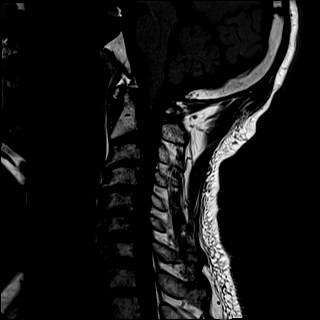
[im 15/15]
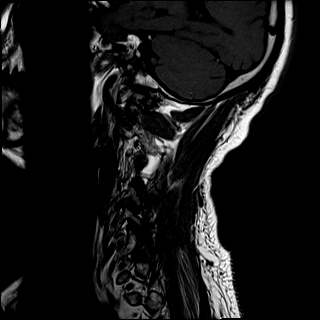

[Series 19: T2 · axial · 3.0mm · 0.66mm/px · z∈[-86,+26]mm · 8 of 37 slices shown (2 of 2)]
[im 1/37]
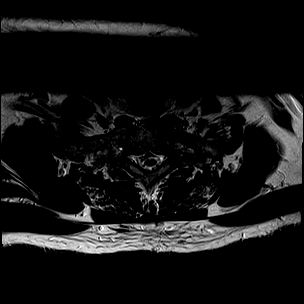
[im 6/37]
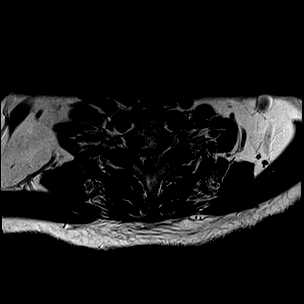
[im 11/37]
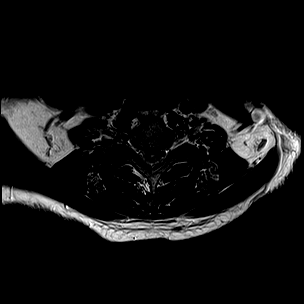
[im 16/37]
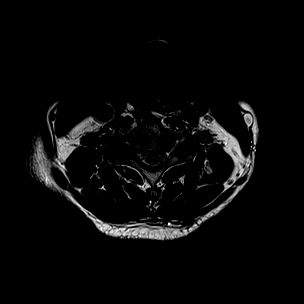
[im 21/37]
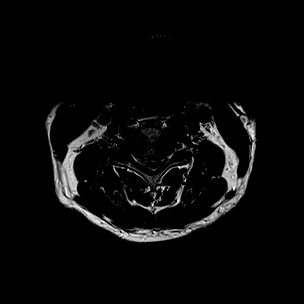
[im 26/37]
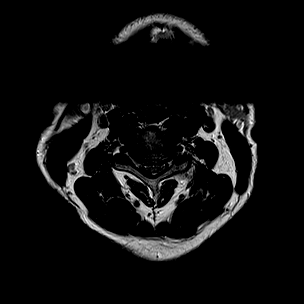
[im 31/37]
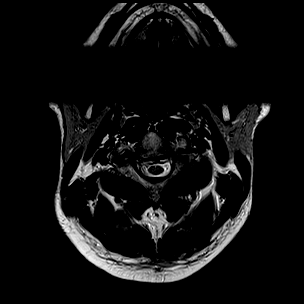
[im 37/37]
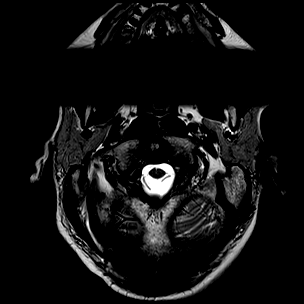

[Series 21: T1 · axial · non-contrast · 3.0mm · 0.35mm/px · z∈[-81,+31]mm · 9 of 38 slices shown (2 of 2)]
[im 1/38]
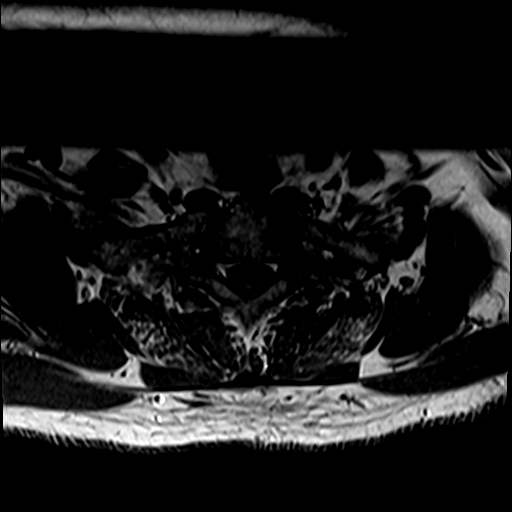
[im 5/38]
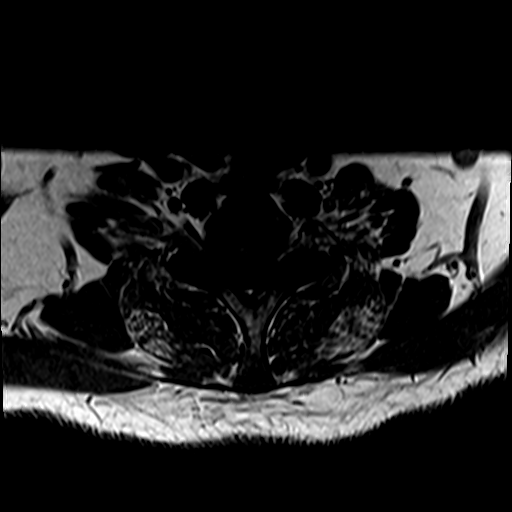
[im 10/38]
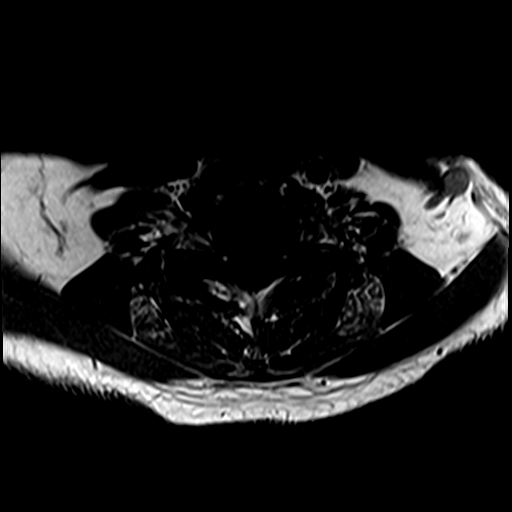
[im 14/38]
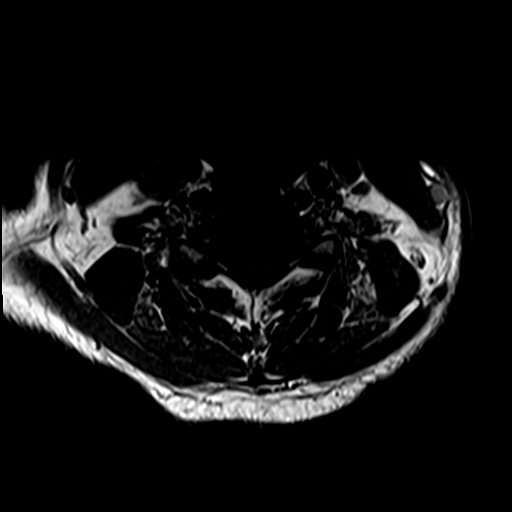
[im 19/38]
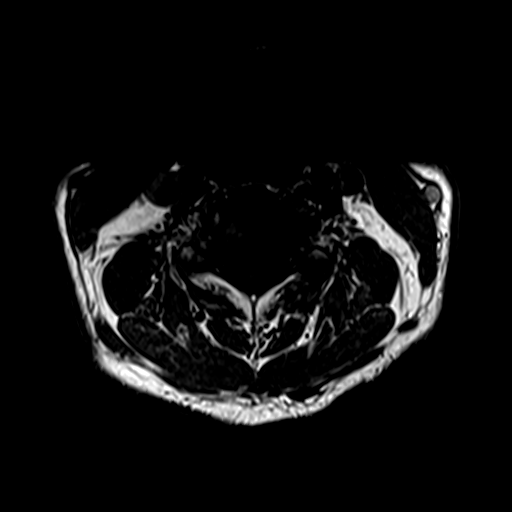
[im 24/38]
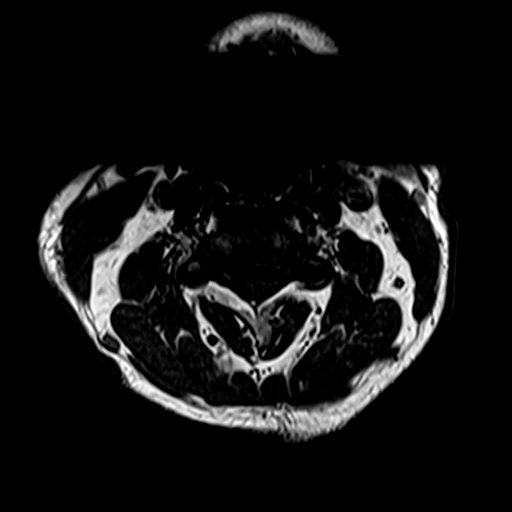
[im 28/38]
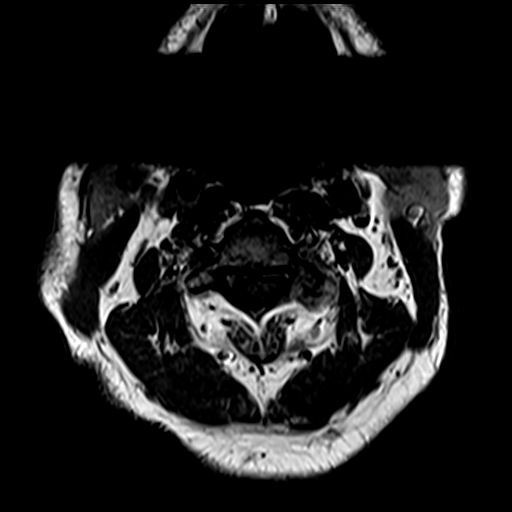
[im 33/38]
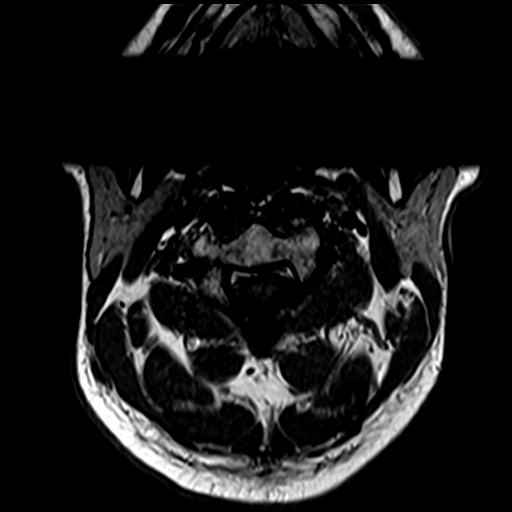
[im 38/38]
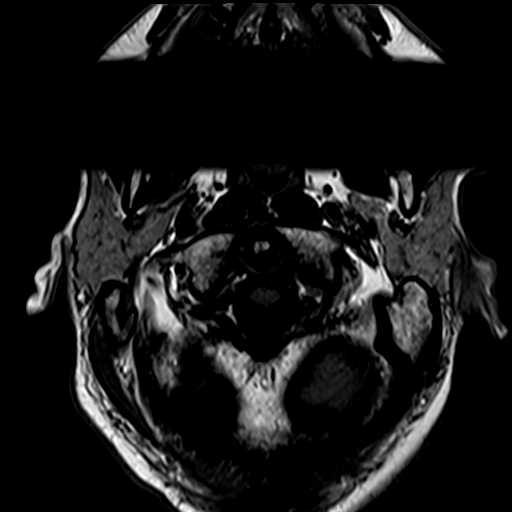

[Series 22: T1 fat-sat post-contrast · sagittal · 3.0mm · 0.43mm/px · 2 of 15 slices shown]
[im 1/15]
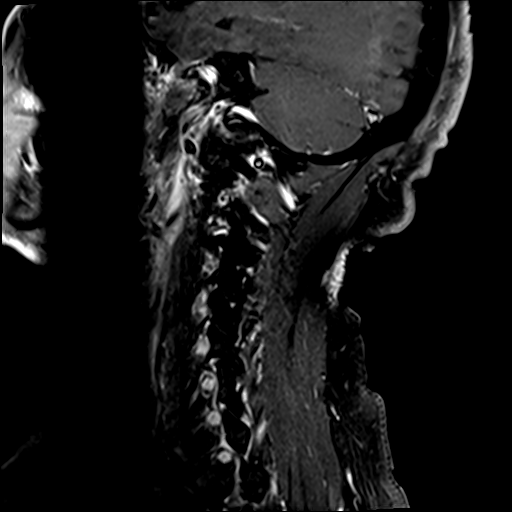
[im 8/15]
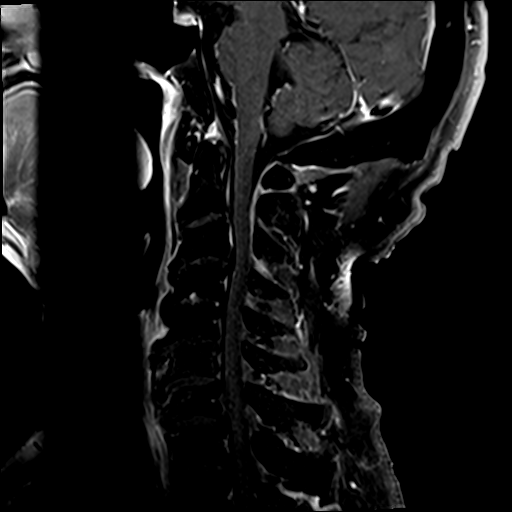

[26 of 48 positions shown; findings below may reference images not displayed]

FINDINGS: Alignment: Straightening with reversal of the normal cervical
lordosis, apex at C3-4. Trace retrolisthesis of C3 on C4, with trace
anterolisthesis of C7 on T1. Findings chronic and facet mediated.

Vertebrae: Vertebral body height maintained without acute or chronic
fracture. Bone marrow signal intensity diffusely heterogeneous
without discrete or worrisome osseous lesion. No evidence for
osseous metastatic disease within the cervical spine. Mild
ill-defined edema within the T6 and T7 vertebral bodies favored to
be degenerative in nature. No other abnormal marrow edema or
enhancement.

Cord: Patchy signal abnormality seen involving the cervical cord at
the level of C3-4 (series 19, image 16) and C4-5 (series 19, image
21) and C5-6 (series 19, image 26). Findings consistent with chronic
myelomalacia, likely due to compressive stenosis. No abnormal
enhancement.

Posterior Fossa, vertebral arteries, paraspinal tissues: Mild
chronic microvascular ischemic disease noted within the pons.
Visualized brain and posterior fossa otherwise unremarkable.
Craniocervical junction normal. Paraspinous soft tissues within
normal limits. Normal flow voids seen within the vertebral arteries
bilaterally.

Disc levels:

C2-C3: Disc desiccation without significant disc bulge. Moderate
left with mild right facet arthrosis, with ankylosis at the left
C2-3 facet. No spinal stenosis. Foramina remain patent.

C3-C4: Degenerative intervertebral disc space narrowing with diffuse
disc osteophyte complex. Broad posterior component flattens and
effaces the ventral thecal sac. Secondary cord flattening with cord
signal changes, consistent with chronic myelomalacia. Resultant
fairly severe spinal stenosis with the thecal sac measuring 5-6 mm
in AP diameter. Severe left with moderate right C4 foraminal
stenosis.

C4-C5: Mild intervertebral disc space narrowing with diffuse disc
osteophyte complex. Broad posterior component flattens and partially
effaces the ventral thecal sac. Secondary cord flattening with
patchy cord signal changes as above. Resultant fairly severe spinal
stenosis with the thecal sac measuring 5 mm in AP diameter. Severe
right worse than left C5 foraminal stenosis.

C5-C6: Degenerative intervertebral disc space narrowing with diffuse
disc osteophyte complex, slightly asymmetric to the right.
Flattening and effacement of the ventral thecal sac. Secondary cord
flattening with subtle cord signal changes as above. Severe spinal
stenosis with the thecal sac measuring 5-6 mm in AP diameter. Severe
right worse than left C6 foraminal narrowing.

C6-C7: Disc bulge with bilateral uncovertebral and facet
hypertrophy. No significant spinal stenosis. Moderate left worse
than right C7 foraminal narrowing.

C7-T1: Disc bulge with right worse than left uncovertebral disease.
Superimposed facet and ligament flavum hypertrophy. Mild spinal
stenosis. Severe right with moderate left C8 foraminal narrowing.
IMPRESSION: 1. No MRI evidence for metastatic disease within the cervical spine.
2. Age advanced degenerative spondylosis with resultant diffuse
spinal stenosis, severe at C3-4 through C5-6. Associated moderate to
severe bilateral C4 through C8 foraminal narrowing as above.
3. Patchy cord signal changes at the levels of C3-4 through C5-6,
consistent with chronic myelomalacia.

## 2021-11-25 IMAGING — MR MR THORACIC SPINE WO/W CM
5 of 9 series · 21 of 48 positions shown · IV contrast (gadavist)
Comparison: Prior bone scan from [DATE].

CLINICAL DATA: Initial evaluation for prostate cancer, abnormal
bone scan.

EXAM:
MRI THORACIC WITHOUT AND WITH CONTRAST
TECHNIQUE: Multiplanar and multiecho pulse sequences of the thoracic spine were
obtained without and with intravenous contrast.
CONTRAST:  10mL GADAVIST GADOBUTROL 1 MMOL/ML IV SOLN

[Series 18: T1 · sagittal · 3.3mm · 0.62mm/px · 2 of 13 slices shown (1 of 3)]
[im 1/13]
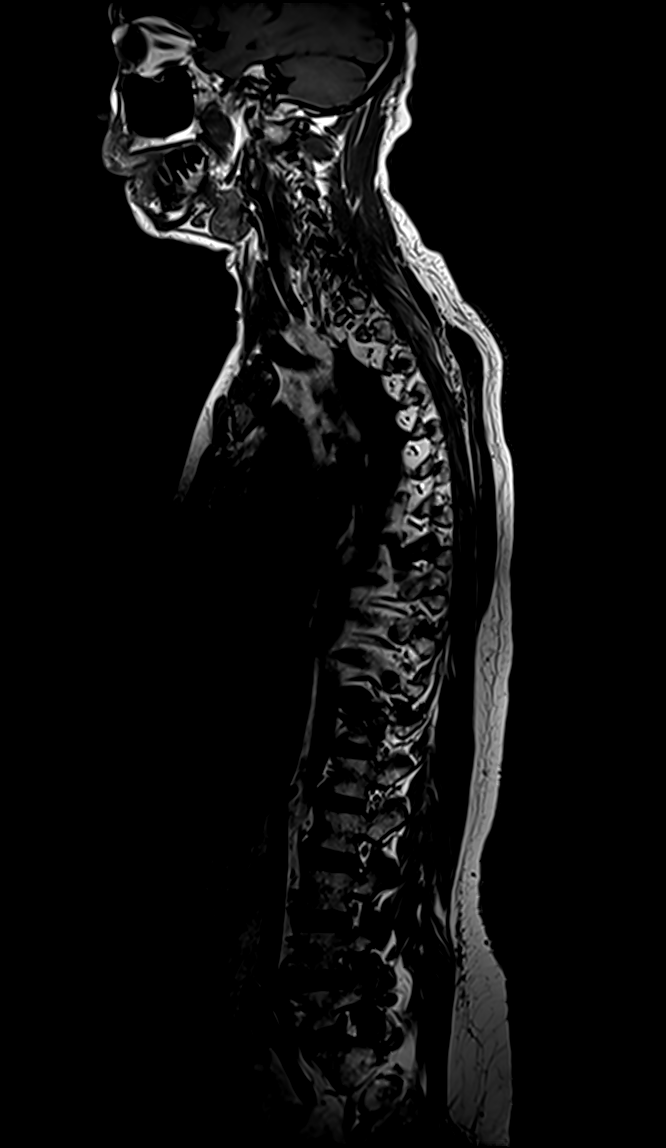
[im 13/13]
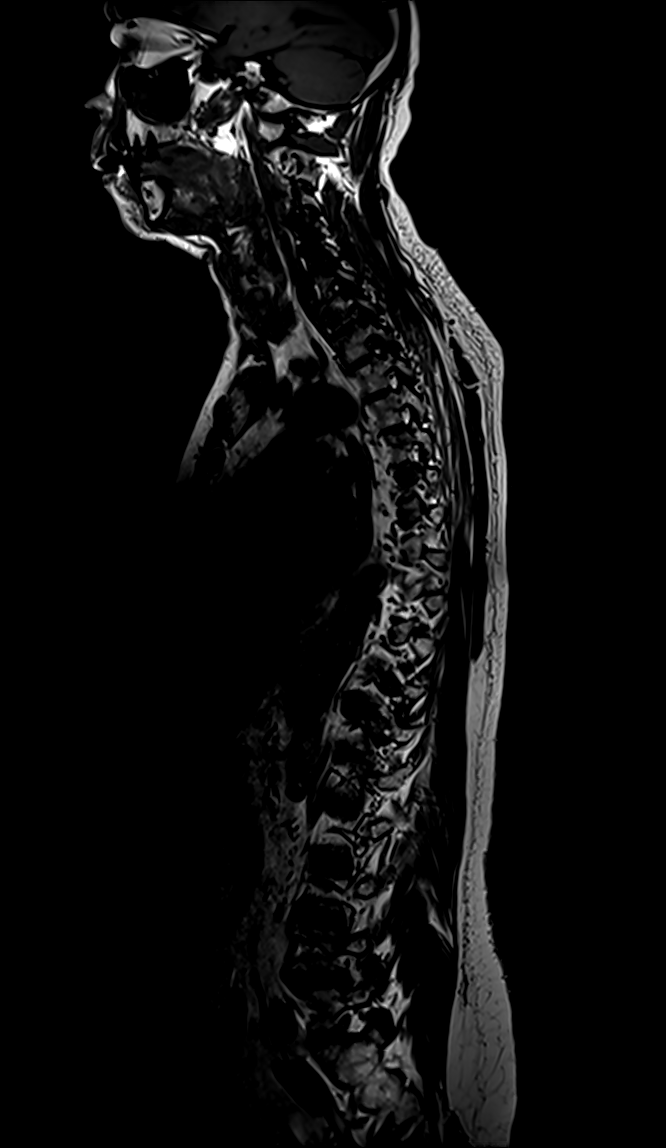

[Series 19: T2 · sagittal · 3.0mm · 0.76mm/px · 3 of 17 slices shown (1 of 2)]
[im 1/17]
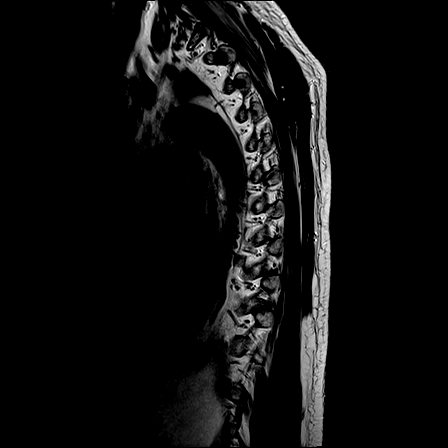
[im 9/17]
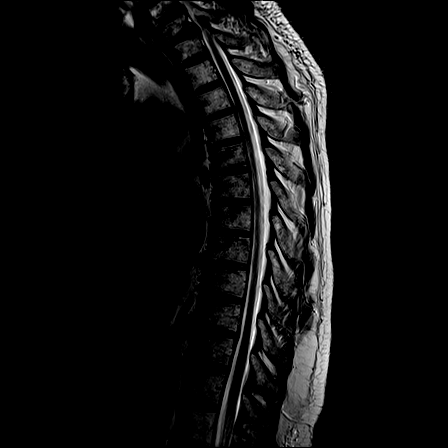
[im 17/17]
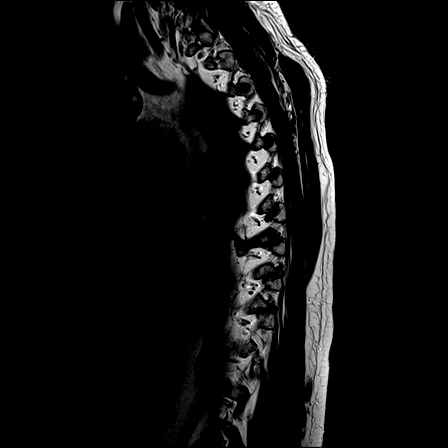

[Series 21: T1 · sagittal · 3.0mm · 0.79mm/px · 4 of 17 slices shown (2 of 3)]
[im 1/17]
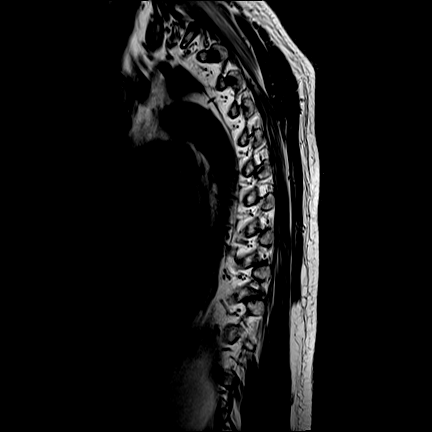
[im 6/17]
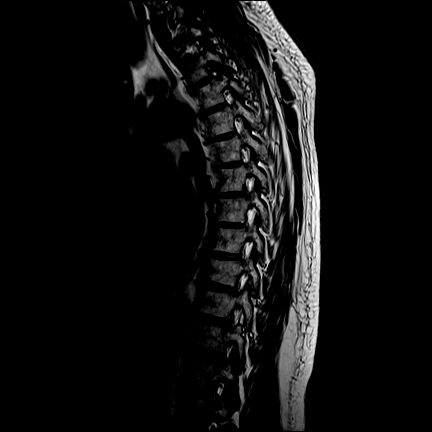
[im 11/17]
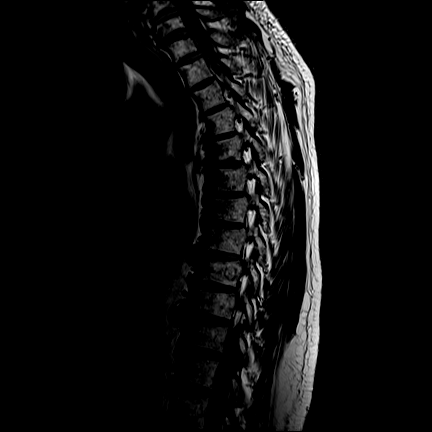
[im 17/17]
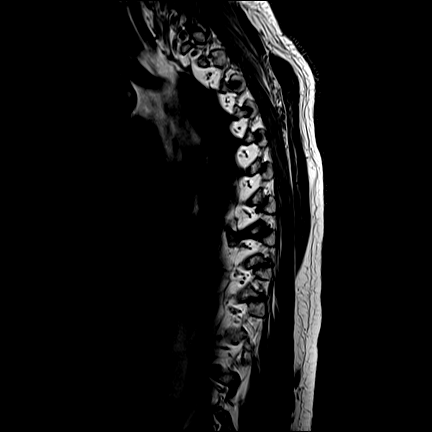

[Series 22: T2 · axial · 5.0mm · 0.59mm/px · z∈[-304,-82]mm · 8 of 36 slices shown (2 of 2)]
[im 1/36]
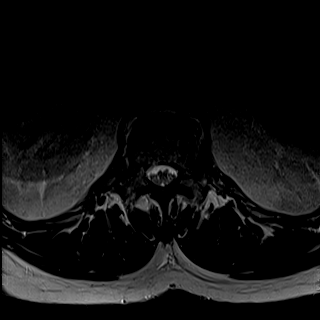
[im 6/36]
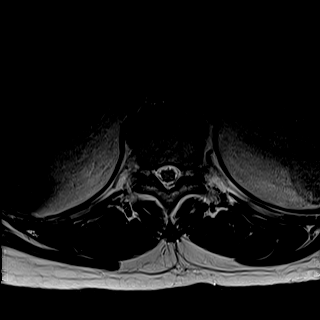
[im 11/36]
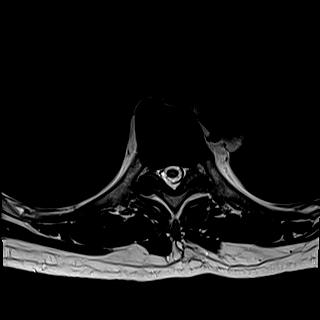
[im 16/36]
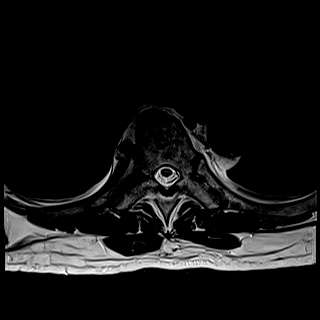
[im 21/36]
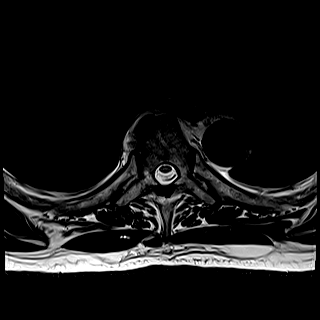
[im 26/36]
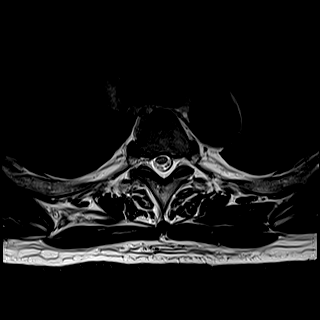
[im 31/36]
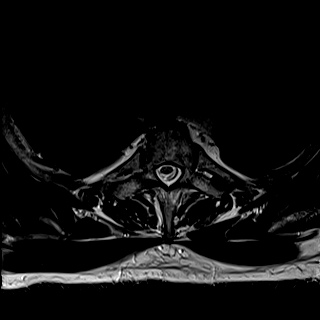
[im 36/36]
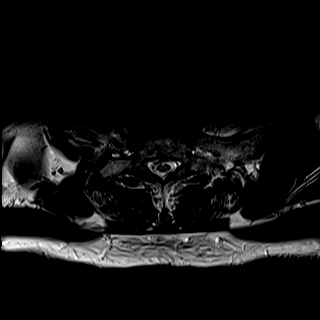

[Series 24: T1 · axial · non-contrast · 5.0mm · 0.33mm/px · z∈[-304,-174]mm · 4 of 36 slices shown (3 of 3)]
[im 1/36]
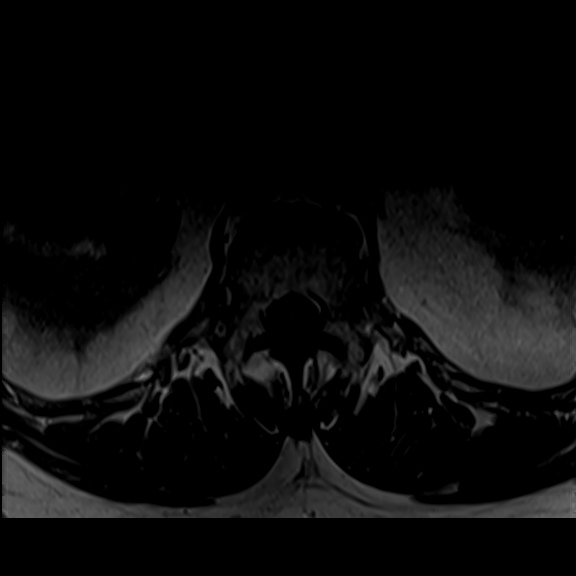
[im 6/36]
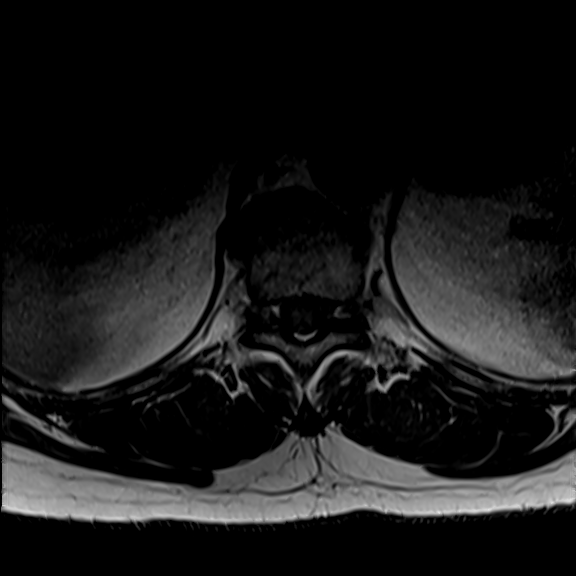
[im 11/36]
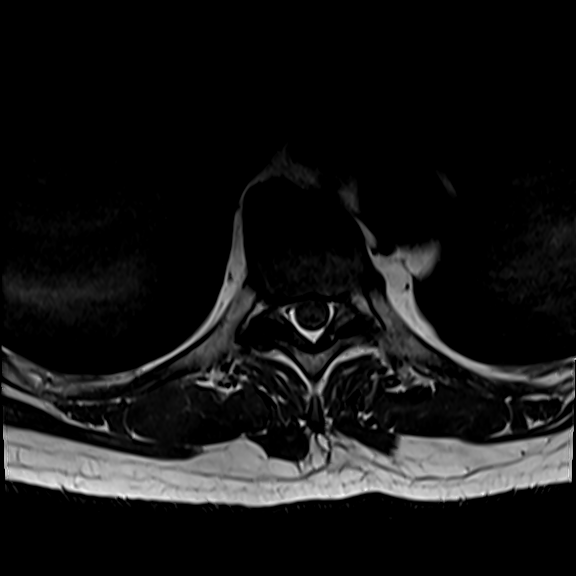
[im 16/36]
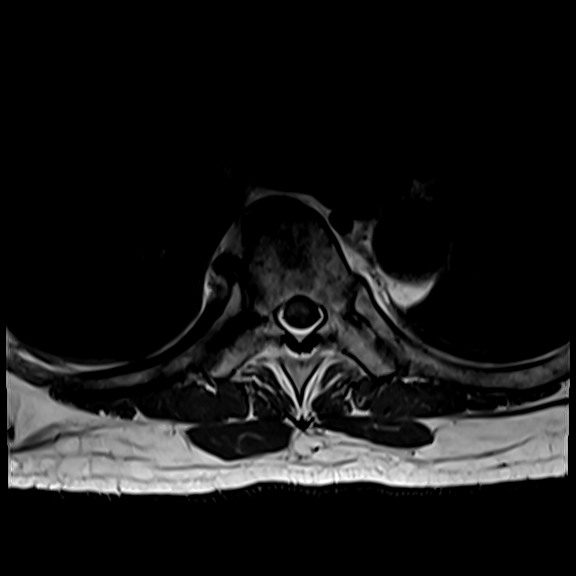

[21 of 48 positions shown; findings below may reference images not displayed]

FINDINGS: Alignment: Physiologic with preservation of the normal thoracic
kyphosis. No listhesis.

Vertebrae: Vertebral body height maintained without acute or chronic
fracture. Bone marrow signal intensity diffusely heterogeneous.
There is a 1.4 cm T1/T2 hyperintense lesion within the posterior
aspect of T9 (series 21, image 7). Lesion demonstrates associated
STIR hyperintensity (series 20, image 7). While this is somewhat
indeterminate, this lesion demonstrates a somewhat trabeculated
internal morphology, and is favored to reflect an atypical
hemangioma. Otherwise, no other discrete or worrisome osseous
lesions seen elsewhere within the thoracic spine to suggest
metastatic disease. Mild marrow edema at the superior endplate of T8
felt to be consistent with degenerative change. No other abnormal
marrow edema or enhancement.

Cord:  Normal signal and morphology.  No abnormal enhancement.

Paraspinal and other soft tissues: Paraspinous soft tissues within
normal limits. Trace layering bilateral pleural effusions.

Disc levels:

Normal expected for age multilevel disc desiccation seen throughout
the thoracic spine. No significant disc bulge or focal disc
herniation. Mild multilevel facet hypertrophy. No significant spinal
stenosis. Foramina remain grossly patent.
IMPRESSION: 1. 1.4 cm lesion involving the posterior aspect of the T9 vertebral
body, indeterminate, but favored to reflect an atypical hemangioma.
Short interval follow-up MRI in 3 months to assess stability
recommended.
2. Otherwise negative MRI of the thoracic spine for age. No other
evidence for metastatic disease.
3. Trace layering bilateral pleural effusions.

## 2021-11-25 MED ORDER — GADOBUTROL 1 MMOL/ML IV SOLN
10.0000 mL | Freq: Once | INTRAVENOUS | Status: AC | PRN
  Administered 2021-11-25: 10 mL via INTRAVENOUS

## 2021-11-26 ENCOUNTER — Telehealth: Payer: Self-pay

## 2021-11-26 DIAGNOSIS — C61 Malignant neoplasm of prostate: Secondary | ICD-10-CM

## 2021-11-26 NOTE — Telephone Encounter (Signed)
-----   Message from Primus Bravo, MD sent at 11/26/2021  2:36 PM EST ----- Please inform Jamie Gonzalez that his plain films and MRI studies do not show any obvious abnormal areas suggestive of metastatic disease.   We had discussed a prostate MRI for further evaluation.  If he would like to proceed with this, we can get this scheduled.

## 2021-11-26 NOTE — Telephone Encounter (Signed)
Patient informed and wishes to proceed with prostate MRI

## 2021-11-29 NOTE — Telephone Encounter (Signed)
Hey Traci  Do I need to change the order?? Dr. Felipa Eth said 3T MRI at [facility]?

## 2021-12-08 ENCOUNTER — Ambulatory Visit (HOSPITAL_COMMUNITY): Payer: Medicare HMO

## 2021-12-21 ENCOUNTER — Ambulatory Visit
Admission: RE | Admit: 2021-12-21 | Discharge: 2021-12-21 | Disposition: A | Discharge status: Home / Self Care | Payer: Medicare HMO | Source: Ambulatory Visit | Attending: Urology | Admitting: Urology

## 2021-12-21 DIAGNOSIS — C61 Malignant neoplasm of prostate: Secondary | ICD-10-CM

## 2021-12-21 DIAGNOSIS — R590 Localized enlarged lymph nodes: Secondary | ICD-10-CM | POA: Diagnosis not present

## 2021-12-21 DIAGNOSIS — N402 Nodular prostate without lower urinary tract symptoms: Secondary | ICD-10-CM | POA: Diagnosis not present

## 2021-12-21 IMAGING — MR MR PROSTATE WO/W CM
12 series · 48 of 48 positions shown · IV contrast (multihance)
Comparison: None.

CLINICAL DATA: Prostate biopsy [DATE] revealed Gleason 4+4=8
prostate adenocarcinoma at the right apex and 4+3=7 prostate
adenocarcinoma the right lateral apex.

EXAM:
MR PROSTATE WITHOUT AND WITH CONTRAST
TECHNIQUE: Multiplanar multisequence MRI images were obtained of the pelvis
centered about the prostate. Pre and post contrast images were
obtained.
CONTRAST:  20mL MULTIHANCE GADOBENATE DIMEGLUMINE 529 MG/ML IV SOLN

[Series 3: T2 · coronal · 3.0mm · 0.56mm/px · 1 of 25 slices shown (1 of 3)]
[im 1/25]
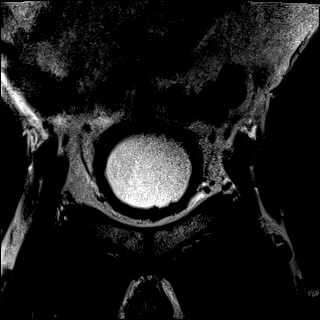

[Series 4: T1 · axial · 5.0mm · 1.25mm/px · 1 of 80 slices shown]
[im 1/80]
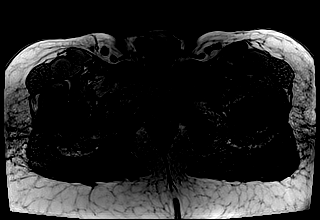

[Series 5: DWI · axial · 3.0mm · 1.75mm/px · 1 of 93 slices shown (1 of 3)]
[im 1/93]
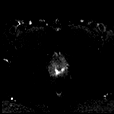

[Series 6: DWI · axial · 3.0mm · 1.75mm/px · 1 of 32 slices shown (2 of 3)]
[im 1/32]
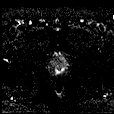

[Series 7: DWI · axial · 3.0mm · 1.75mm/px · 1 of 32 slices shown (3 of 3)]
[im 1/32]
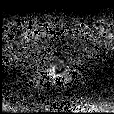

[Series 9: T2 · axial · 3.0mm · 0.56mm/px · 1 of 32 slices shown (2 of 3)]
[im 1/32]
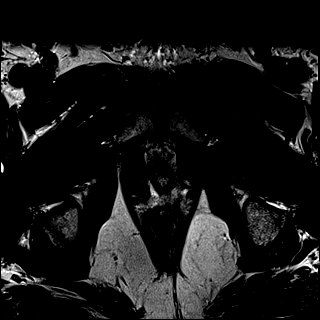

[Series 10: T2 · axial · 1.0mm · 1.04mm/px · 1 of 104 slices shown (3 of 3)]
[im 1/104]
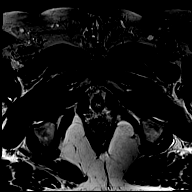

[Series 11: pre t1_twist_tra_dyn · axial · non-contrast · 3.5mm · 0.83mm/px · 1 of 30 slices shown]
[im 1/30]
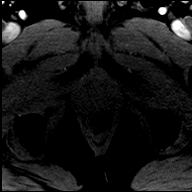

[Series 12: post t1_twist_tra_dyn-copy center · axial · non-contrast · 3.5mm · 0.83mm/px · z∈[+19,+121]mm · 18 of 895 slices shown]
[im 1/895]
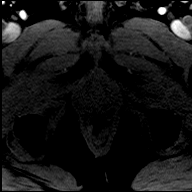
[im 53/895]
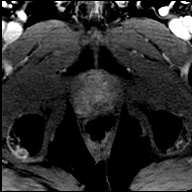
[im 106/895]
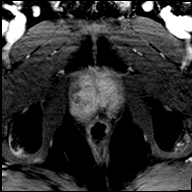
[im 158/895]
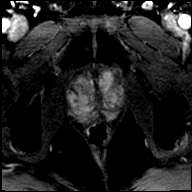
[im 211/895]
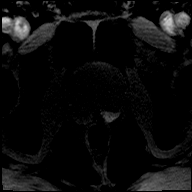
[im 263/895]
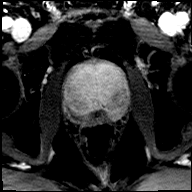
[im 316/895]
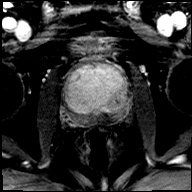
[im 369/895]
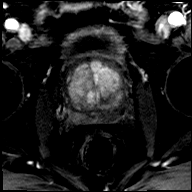
[im 421/895]
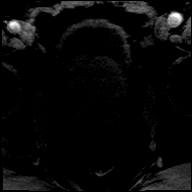
[im 474/895]
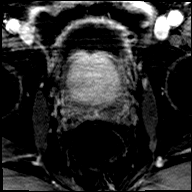
[im 526/895]
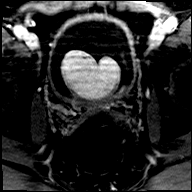
[im 579/895]
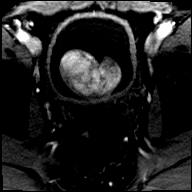
[im 632/895]
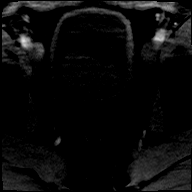
[im 684/895]
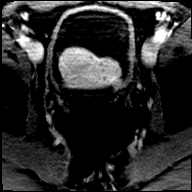
[im 737/895]
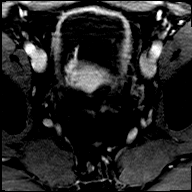
[im 789/895]
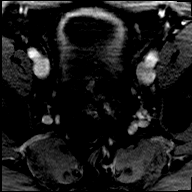
[im 842/895]
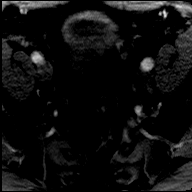
[im 895/895]
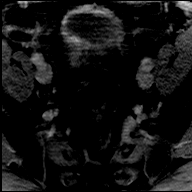

[Series 13: post t1_twist_tra_dyn-copy cent_sub · axial · 3.5mm · 0.83mm/px · z∈[+19,+121]mm · 18 of 868 slices shown]
[im 1/868]
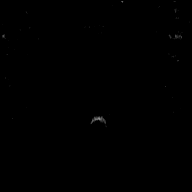
[im 52/868]
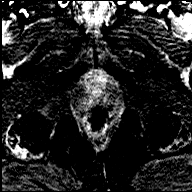
[im 103/868]
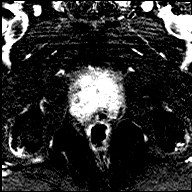
[im 154/868]
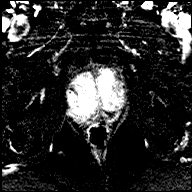
[im 205/868]
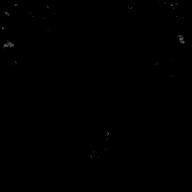
[im 256/868]
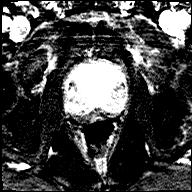
[im 307/868]
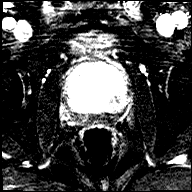
[im 358/868]
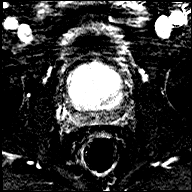
[im 409/868]
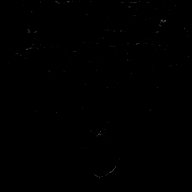
[im 460/868]
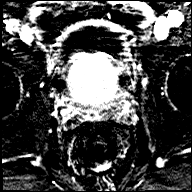
[im 511/868]
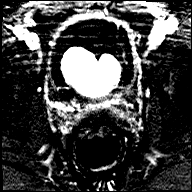
[im 562/868]
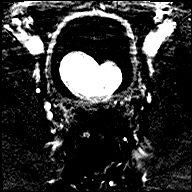
[im 613/868]
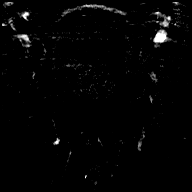
[im 664/868]
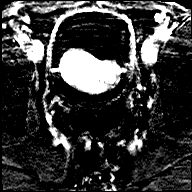
[im 715/868]
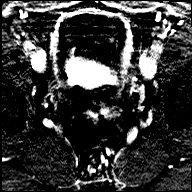
[im 766/868]
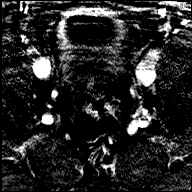
[im 817/868]
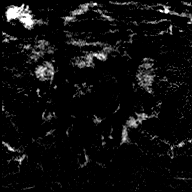
[im 868/868]
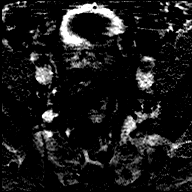

[Series 14: t1_vibe_dixon_tra_f · axial · 2.5mm · 0.91mm/px · z∈[+11,+209]mm · 2 of 80 slices shown]
[im 1/80]
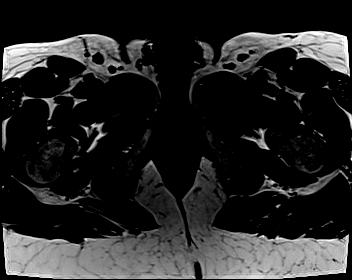
[im 80/80]
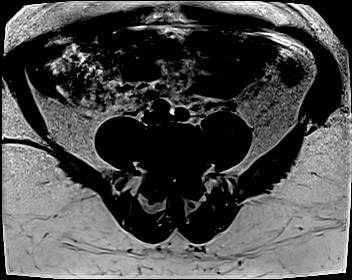

[Series 15: t1_vibe_dixon_tra_w · axial · 2.5mm · 0.91mm/px · z∈[+11,+209]mm · 2 of 80 slices shown]
[im 1/80]
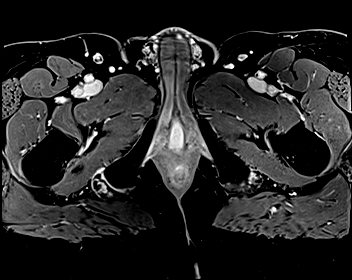
[im 80/80]
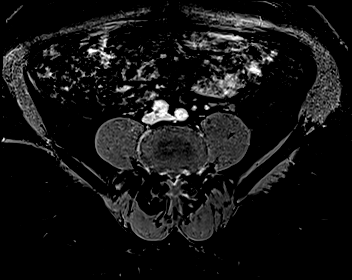

[48 of 48 positions shown; findings below may reference images not displayed]

FINDINGS: Prostate:

Region of interest # 1: PI-RADS category 5 lesion of the right
anterior and right posterolateral peripheral zone the apex with
focally reduced T2 signal (image 98, series 10), focal low ADC map
activity (image 31, series 6), moderate restriction of diffusion
(image 31, series 7) and focal early enhancement (image 208, series
13). There is also abnormal focal bulging of the prostate capsule
favoring transcapsular spread. This measures 1.77 cc (2.3 by 1.0 by
1.6 cm).

Region of interest # 2: PI-RADS category 4 lesion of the right
posteromedial peripheral zone in the mid gland, with focally reduced
T2 signal (image 74, series 10), focal restriction of diffusion
(image 24, series 7), and focal early enhancement (image 171, series
13). This measures 0.14 cc (0.6 by 0.4 by 0.5 cm).

Region of interest # 3: PI-RADS category 3 lesion of the left
posterolateral and left anterior peripheral zone at the apex,
focally reduced T2 signal (image 93, series 10 but no substantial
early enhancement or restriction of diffusion. This measures 0.56 cc
(1.4 by 0.7 by 0.9 cm).

Encapsulated nodularity in the transition zone compatible with
benign prostatic hypertrophy. Prominent median lobe indents the
bladder base.

Accentuated T1 signal in the peripheral zone likely represents blood
products from prior biopsy.

Volume: 3D volumetric analysis: Prostate volume 137.79 cc (6.4 by
5.7 by 8.8 cm).

Transcapsular spread: High suspicion given the capsular bulge
associated with region of interest # 1

Seminal vesicle involvement: Absent

Neurovascular bundle involvement: Absent

Pelvic adenopathy: Absent

Bone metastasis: Absent

Other findings: Suspected atherosclerosis.
IMPRESSION: 1. PI-RADS category 5 lesion of the right apical peripheral zone
with suspected transcapsular spread.
2. There is also a PI-RADS category 4 lesion of the right
posteromedial peripheral zone in the mid gland, and a PI-RADS
category 3 lesion of the left apical peripheral zone.
3. Targeting data sent to [facility].
4. Encapsulated nodularity in the transition zone compatible with
benign prostatic hypertrophy. Prostatomegaly.

## 2021-12-21 MED ORDER — GADOBENATE DIMEGLUMINE 529 MG/ML IV SOLN
20.0000 mL | Freq: Once | INTRAVENOUS | Status: AC | PRN
  Administered 2021-12-21: 20 mL via INTRAVENOUS

## 2021-12-23 ENCOUNTER — Other Ambulatory Visit: Payer: Self-pay | Admitting: Urology

## 2021-12-23 DIAGNOSIS — C61 Malignant neoplasm of prostate: Secondary | ICD-10-CM

## 2022-01-18 DIAGNOSIS — R3121 Asymptomatic microscopic hematuria: Secondary | ICD-10-CM | POA: Diagnosis not present

## 2022-01-21 DIAGNOSIS — R3121 Asymptomatic microscopic hematuria: Secondary | ICD-10-CM | POA: Diagnosis not present

## 2022-01-21 DIAGNOSIS — R319 Hematuria, unspecified: Secondary | ICD-10-CM | POA: Diagnosis not present

## 2022-01-31 ENCOUNTER — Encounter (HOSPITAL_COMMUNITY)
Admission: RE | Admit: 2022-01-31 | Discharge: 2022-01-31 | Disposition: A | Discharge status: Home / Self Care | Payer: Medicare HMO | Source: Ambulatory Visit | Attending: Urology | Admitting: Urology

## 2022-01-31 ENCOUNTER — Other Ambulatory Visit: Payer: Self-pay

## 2022-01-31 DIAGNOSIS — C61 Malignant neoplasm of prostate: Secondary | ICD-10-CM | POA: Diagnosis not present

## 2022-01-31 DIAGNOSIS — D175 Benign lipomatous neoplasm of intra-abdominal organs: Secondary | ICD-10-CM | POA: Diagnosis not present

## 2022-01-31 DIAGNOSIS — I96 Gangrene, not elsewhere classified: Secondary | ICD-10-CM | POA: Diagnosis not present

## 2022-01-31 DIAGNOSIS — J9811 Atelectasis: Secondary | ICD-10-CM | POA: Diagnosis not present

## 2022-01-31 MED ORDER — PIFLIFOLASTAT F 18 (PYLARIFY) INJECTION
9.0000 | Freq: Once | INTRAVENOUS | Status: AC
  Administered 2022-01-31: 8.7 via INTRAVENOUS

## 2022-02-01 DIAGNOSIS — M62838 Other muscle spasm: Secondary | ICD-10-CM | POA: Diagnosis not present

## 2022-02-01 DIAGNOSIS — M6281 Muscle weakness (generalized): Secondary | ICD-10-CM | POA: Diagnosis not present

## 2022-02-04 ENCOUNTER — Other Ambulatory Visit: Payer: Self-pay | Admitting: Urology

## 2022-02-16 DIAGNOSIS — M6281 Muscle weakness (generalized): Secondary | ICD-10-CM | POA: Diagnosis not present

## 2022-02-16 DIAGNOSIS — M62838 Other muscle spasm: Secondary | ICD-10-CM | POA: Diagnosis not present

## 2022-02-16 DIAGNOSIS — N393 Stress incontinence (female) (male): Secondary | ICD-10-CM | POA: Diagnosis not present

## 2022-02-23 NOTE — Progress Notes (Signed)
I called pt to introduce myself as the Prostate Nurse Navigator and the Coordinator of the Prostate [place]. ?  ?1. I confirmed with the patient/wife he is aware of his referral to the clinic 4/25, arriving @ 12:30 pm.  ?  ?2. I discussed the format of the clinic and the physicians he will be seeing that day. ?  ?3. I discussed where the clinic is located and how to contact me. ?  ?4. I confirmed his address and informed him I would be mailing a packet of information and forms to be completed. I asked him to bring them with him the day of his appointment. Confirmed patient has received package. ?  ?He voiced understanding of the above. I asked him to call me if he has any questions or concerns regarding his appointments or the forms he needs to complete.  ?

## 2022-02-28 NOTE — Progress Notes (Signed)
RN spoke with patient's wife.  Confirmed upcoming [facility] appointment for 4/25, and reminder to complete and bring paperwork.  Verbalized understanding and agreement.   ?

## 2022-03-01 ENCOUNTER — Ambulatory Visit
Admission: RE | Admit: 2022-03-01 | Discharge: 2022-03-01 | Disposition: A | Discharge status: Home / Self Care | Payer: Medicare HMO | Source: Ambulatory Visit | Attending: Radiation Oncology | Admitting: Radiation Oncology

## 2022-03-01 ENCOUNTER — Inpatient Hospital Stay: Payer: Medicare HMO | Attending: Radiation Oncology

## 2022-03-01 ENCOUNTER — Other Ambulatory Visit: Payer: Self-pay | Admitting: Genetic Counselor

## 2022-03-01 ENCOUNTER — Inpatient Hospital Stay (HOSPITAL_BASED_OUTPATIENT_CLINIC_OR_DEPARTMENT_OTHER): Payer: Medicare HMO | Admitting: Oncology

## 2022-03-01 ENCOUNTER — Inpatient Hospital Stay (HOSPITAL_BASED_OUTPATIENT_CLINIC_OR_DEPARTMENT_OTHER): Payer: Medicare HMO | Admitting: Genetic Counselor

## 2022-03-01 VITALS — BP 146/92 | HR 83 | Temp 96.5°F | Resp 18 | Ht 72.0 in | Wt 240.6 lb

## 2022-03-01 DIAGNOSIS — Z803 Family history of malignant neoplasm of breast: Secondary | ICD-10-CM | POA: Diagnosis not present

## 2022-03-01 DIAGNOSIS — C61 Malignant neoplasm of prostate: Secondary | ICD-10-CM

## 2022-03-01 DIAGNOSIS — F17210 Nicotine dependence, cigarettes, uncomplicated: Secondary | ICD-10-CM | POA: Diagnosis not present

## 2022-03-01 DIAGNOSIS — Z8546 Personal history of malignant neoplasm of prostate: Secondary | ICD-10-CM | POA: Diagnosis not present

## 2022-03-01 DIAGNOSIS — Z1379 Encounter for other screening for genetic and chromosomal anomalies: Secondary | ICD-10-CM

## 2022-03-01 DIAGNOSIS — M899 Disorder of bone, unspecified: Secondary | ICD-10-CM | POA: Diagnosis not present

## 2022-03-01 DIAGNOSIS — Z191 Hormone sensitive malignancy status: Secondary | ICD-10-CM | POA: Diagnosis not present

## 2022-03-01 LAB — GENETIC SCREENING ORDER

## 2022-03-01 NOTE — Progress Notes (Signed)
?Radiation Oncology         [phone] ?________________________________ ? ?Multidisciplinary Prostate Cancer Clinic ? ?Initial Radiation Oncology Consultation ? ?Name: Jamie Gonzalez MRN: [id number]  ?Date: 03/01/2022  DOB: July 19, 1956 ? ?JM:EQAS, [facility]  Raynelle Bring, MD  ? ?REFERRING PHYSICIAN: Raynelle Bring, MD ? ?DIAGNOSIS: 66 y.o. 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 ?  ICD-10-CM   ?1. Prostate cancer ([place]); high risk; Gleason group 3,4; PSA 14.8  C61   ?  ? ? ?HISTORY OF PRESENT ILLNESS::Jamie Gonzalez is a 66 y.o. gentleman.  He was noted to have an elevated PSA of 14.8 in 07/01/21, by his primary care provider, Collene Mares, PA-C. His previous PSA in 01/2019 was 3.8. Accordingly, he was referred for evaluation in urology by Dr. Felipa Eth on 07/27/21,  digital rectal examination was performed at that time revealing no nodules. Repeat PSA from that day showed persistent elevation at 14.6. Therefore, the patient proceeded to transrectal ultrasound with 12 biopsies of the prostate on 09/16/21.  The prostate volume measured 123.86 cc.  Out of 12 core biopsies, 2 were positive.  The maximum Gleason score was 4+4, and this was seen in the right apex. Additionally, Gleason 4+3 was seen in the right apex lateral. ? ? ? ?A staging bone scan was performed on 10/26/21 showing several areas of indeterminate increased activity within the lower sternum, lower cervical and throughout the thoracic spine, and a right mid posterior rib. These were further evaluated with right rib and sternum x-rays and cervical and thoracic spine MRI's in 11/2021-- while the spine MRI's and sternum x-ray were negative, the right rib x-ray showed a suspicious lesion in the right 6th rib. ? ?He also underwent prostate MRI on 12/21/21 showing a PI-RADS 5 lesion in the right apical peripheral zone with suspected transcapsular spread, a PI-RADS 4  lesion of the right posteromedial peripheral zone mid gland and a PI-RADS 3 lesion of the left apical peripheral zone. CT A/P on 01/21/22 was without evidence of metastatic disease.  ? ?Given the equivocal findings in the right 6th rib, a PSMA PET scan was performed on 01/31/22 showing radiotracer accumulation at 2 discrete sites in prostate gland without signs of nodal or hepatic metastases.  There was increased uptake in the right 6th rib, suspicious for monostotic skeletal involvement versus fibrous dysplasia. Additionally, there was a lipomatous lesion along the right flank with signs of internal fat necrosis, favored benign. ? ?The patient reviewed the biopsy and imaging results with his urologist and he has kindly been referred today to the multidisciplinary prostate cancer clinic for presentation of pathology and radiology studies in our conference for discussion of potential radiation treatment options and clinical evaluation. 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 ?  ?He had previously met in consultation with Dr. Alinda Money in mid-March 2023 and is tentatively scheduled for prostatectomy on 03/17/22. ? ? ?PREVIOUS RADIATION THERAPY: No ? ?PAST MEDICAL HISTORY:  has a past medical history of Hypertension and Tubular adenoma.   ? ?PAST SURGICAL HISTORY: ?Past Surgical History:  ?Procedure Laterality Date  ? COLONOSCOPY  05/22/2009  ? Dr.Rourk- normal rectum, polyps at the splenic flexure and sigmoid, the remainder of the colonic mucosa appeared normal. bx= tubular adenoma  ?  COLONOSCOPY N/A 08/19/2014  ? Procedure: COLONOSCOPY;  Surgeon: Daneil Dolin, MD;  Location: [facility];  Service: Endoscopy;  Laterality: N/A;  1130  ? ? ?FAMILY HISTORY: family history includes Breast cancer (age of onset: 61) in his niece; Cancer in his cousin and  maternal aunt. ? ?SOCIAL HISTORY:  reports that he has been smoking cigarettes. He has been smoking an average of .5 packs per day. He has never used smokeless tobacco. He reports that he does not currently use alcohol. He reports that he does not use drugs. ? ?ALLERGIES: Patient has no known allergies. ? ?MEDICATIONS:  ?Current Outpatient Medications  ?Medication Sig Dispense Refill  ? diphenhydrAMINE (BENADRYL) 25 MG tablet Take 25 mg by mouth daily as needed for itching. (Patient not taking: Reported on 03/01/2022)    ? rosuvastatin (CRESTOR) 10 MG tablet Take 10 mg by mouth daily.    ? tamsulosin (FLOMAX) 0.4 MG CAPS capsule Take 0.4 mg by mouth daily after breakfast.    ? ?No current facility-administered medications for this encounter.  ? ? ?REVIEW OF SYSTEMS:  On review of systems, the patient reports that he is doing well overall. He denies any chest pain, shortness of breath, cough, fevers, chills, night sweats, unintended weight changes. He denies any bowel disturbances, and denies abdominal pain, nausea or vomiting. He denies any new musculoskeletal or joint aches or pains. His IPSS was 11, indicating mild-moderate urinary symptoms. His SHIM was 16, indicating he as mild-moderate erectile dysfunction. A complete review of systems is obtained and is otherwise negative.  ? ?PHYSICAL EXAM:  ?Wt Readings from Last 3 Encounters:  ?03/01/22 240 lb 9.6 oz (109.1 kg)  ?08/18/21 238 lb 9.6 oz (108.2 kg)  ?07/27/21 235 lb (106.6 kg)  ? ?Temp Readings from Last 3 Encounters:  ?03/01/22 (!) 96.5 ?F (35.8 ?C) (Temporal)  ?09/16/21 98.4 ?F (36.9 ?C) (Oral)  ?08/18/21 98.6 ?F (37 ?C)  ? ?BP Readings from Last 3 Encounters:  ?03/01/22 (!) 146/92  ?09/16/21 138/88  ?08/18/21 125/82  ? ?Pulse Readings from Last 3 Encounters:  ?03/01/22 83  ?09/16/21 80  ?08/18/21 84  ? ? /10 ? ?In general this is a well appearing African American male in no acute distress. He's alert and oriented x4 and appropriate throughout the  examination. Cardiopulmonary assessment is negative for acute distress and he exhibits normal effort.  ? ? ?KPS = 100 ? ?100 - Normal; no complaints; no evidence of disease. ?90   - Able to carry on normal activity; minor signs or symptoms of disease. ?80   - Normal activity with effort; some signs or symptoms of disease. ?53   - Cares for self; unable to carry on normal activity or to do active work. ?60   - Requires occasional assistance, but is able to care for most of his personal needs. ?50   - Requires considerable assistance and frequent medical care. ?70   - Disabled; requires special care and assistance. ?30   - Severely disabled; hospital admission is indicated although death not imminent. ?20   - Very sick; hospital admission necessary; active supportive treatment necessary. ?10   - Moribund; fatal processes progressing rapidly. ?0     - Dead ? ?Karnofsky DA, Abelmann WH, Craver LS and [facility] [phone]) The use of the nitrogen mustards in the palliative treatment of carcinoma: with particular reference to bronchogenic carcinoma Cancer 1 634-56 ? ? ?LABORATORY DATA:  ?No results found for: WBC, HGB, HCT, MCV, PLT ?No  results found for: NA, K, CL, CO2 ?No results found for: ALT, AST, GGT, ALKPHOS, BILITOT ?  ?RADIOGRAPHY: No results found. ?   ?IMPRESSION/PLAN: 66 y.o. gentleman with Stage T3a adenocarcinoma of the prostate with a Gleason score of 4+4 and a PSA of 14.6.   ? ?We discussed the patient's workup and outlined the nature of prostate cancer in this setting. The patient's T stage, Gleason's score, and PSA put him into the high risk group. Accordingly, he is eligible for a variety of potential treatment options including prostatectomy or LT-ADT in combination with either 8 weeks of external radiation or 5 weeks of external radiation with an upfront brachytherapy boost. We discussed the available radiation techniques, and focused on the details and logistics of delivery. The patient is not a  candidate for brachytherapy boost with a prostate volume of >120 cc. We discussed and outlined the risks, benefits, short and long-term effects associated with external beam radiotherapy and compared and contrasted these wit

## 2022-03-01 NOTE — Progress Notes (Signed)
? ?                              Care Plan Summary ? ?Name: Kennith Morss  ?DOB: 1956/09/04  ? ?Your Medical Team:  ? Urologist -  Dr. Raynelle Bring, [facility] ? Radiation Oncologist - Dr. Tyler Pita, [place]  ? Medical Oncologist - Dr. Zola Button, [place] ? ?Recommendations: ?1) Keep surgery as scheduled.   ? ? ?* These recommendations are based on information available as of today?s consult.      Recommendations may change depending on the results of further tests or exams. ? ? ? ?When appointments need to be scheduled, you will be contacted by [facility] and/or [facility]. ? ?Questions?  Please do not hesitate to call Katheren Puller, BSN, RN at [phone] with any questions or concerns.  Kathlee Nations is your Oncology Nurse Navigator and is available to assist you while you?re receiving your medical care at [facility].   ?

## 2022-03-01 NOTE — Consult Note (Signed)
? ? ?Multi-Disciplinary Clinic     03/01/2022  ? ?-------------------------------------------------------------------------------- ?  ?Agnes Lawrence  ?MRN: [id number]  ?DOB: 1956-10-17, 66 year old Male  ?SSN:   ? PRIMARY CARE:  Sharilyn Sites, MD  ?REFERRING:  Luvenia Redden  ?PROVIDER:  Raynelle Bring, M.D.  ?LOCATION:  [facility] [phone]  ?  ? ?-------------------------------------------------------------------------------- ?  ?CC/HPI: CC: Prostate Cancer  ? ?Physician requesting consult: Dr. Joelene Millin  ?PCP: Dr. Sharilyn Sites  ?Location of consultation: [place]  ? ?Mr. Mcleary is a 66 year old gentleman who was evaluated by Dr. Felipa Eth for an elevated PSA of 14.6. He underwent a TRUS biopsy of the prostate on 09/16/21 that demonstrated Gleason 4+4=8 adenocarcinoma of the prostate with 2 out of 12 biopsy cores positive for malignancy. He had a bone scan on 11/24/21 that was negative for metastatic disease. He had an MRI of the prostate on 12/21/21 indicating multiple suspicious lesions with the most concerning PI-RADS 5 lesion at the right apex with concern for EPE in this area. He saw me in mid March for an initial consultation and due to his BPH and response to medical therapy for BPH tentatively had elected surgical treatment. I recommended a PSMA PET scan due to his high risk disease. This was performed on 01/31/22 and demonstrated concern for a solitary metastatic right rib lesion. He presents today for multidisciplinary evaluation and discussion. He is currently tentatively scheduled for surgery in early/mid May.  ? ?He has a history of BPH and takes tamsulosin.  ? ?He was noted to have 3-10 RBCs at his initial evaluation with Dr. Felipa Eth. CT imaging and cystoscopy were negative for concerning pathology. He does have a history of smoking.  ? ?Family history: None.  ? ?Imaging studies:  ?Bone scan (11/24/21): No definite metastatic disease. However, he did  undergo additional imaging with multiple MRIs of both the thoracic and cervical spine. This demonstrated degenerative changes with some indeterminate lesions.  ?MRI (12/21/21): No SVI, LAD, or bone lesions. 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  ?PSMA PET scan (01/31/22): Isolated uptake at right posterior 6th rib concerning for solitary metastatic lesion.  ? ? ?PMH: He has a history of hyperlipidemia.  ?PSH: No abdominal surgeries.  ? ?TNM stage: cT3a N0 M0  ?PSA: 14.6  ?Gleason score: 4+4=8 (GG 4)  ?Biopsy (09/16/22): 2/12 cores positive  ?Left: Benign  ?Right: R apex (40%, 4+4=8), R lateral apex (70%, 4+3=7)  ?Prostate volume: 123.9 cc  ?PSAD: 0.11  ? ?Nomogram  ?OC disease: 13%  ?EPE: 85%  ?SVI: 19%  ?LNI: 28%  ?PFS (5 year, 10 year): 24%, 14%  ? ?Urinary function: IPSS is 9. He has been taking tamsulosin since the fall and has found this to be helpful.  ?Erectile function: SHIM score is 17. He can obtain erections adequate for intercourse most of the time but does find it to be more difficult at times versus others. He has not been treated with medication.  ? ?  ?ALLERGIES: No Allergies   ? ?MEDICATIONS: Tamsulosin Hcl 0.4 mg capsule  ?Rosuvastatin Calcium  ?  ? ?GU PSH: Cystoscopy - 01/18/2022 ?Locm 300-'399Mg'$ /Ml Iodine,1Ml - 01/21/2022 ?Vasectomy ? ?  ? ?NON-GU PSH: No Non-GU PSH   ? ?GU PMH: Stress Incontinence - 02/16/2022 ?History of prostate cancer - 02/01/2022 ?Microscopic hematuria - 01/21/2022, - 01/18/2022 ?Prostate Cancer - 01/18/2022 ?  ? ?NON-GU PMH: Muscle weakness (generalized) - 02/16/2022, - 02/01/2022 ?Other muscle spasm - 02/16/2022, - 02/01/2022 ?  Hypercholesterolemia ?  ? ?FAMILY HISTORY: 2 sons - Runs in Family ?Congestive Heart Failure - Mother ?Diabetes - Mother  ? ?SOCIAL HISTORY: Marital Status: Married ?Preferred Language: [place]; Ethnicity: Not Hispanic Or Latino; Race: Black or African American ?Current Smoking Status: Patient does not smoke anymore. Smoked for 50 years.  ? ?Tobacco Use Assessment Completed: Used  Tobacco in last 30 days? ?Does drink.  ?Drinks 2 caffeinated drinks per day. ?  ? ?REVIEW OF SYSTEMS:    ?GU Review Male:   Patient denies frequent urination, hard to postpone urination, burning/ pain with urination, get up at night to urinate, leakage of urine, stream starts and stops, trouble starting your streams, and have to strain to urinate .  ?Gastrointestinal (Lower):   Patient denies diarrhea and constipation.  ?Gastrointestinal (Upper):   Patient denies nausea and vomiting.  ?Constitutional:   Patient denies fever, night sweats, weight loss, and fatigue.  ?Skin:   Patient denies skin rash/ lesion and itching.  ?Eyes:   Patient denies blurred vision and double vision.  ?Ears/ Nose/ Throat:   Patient denies sore throat and sinus problems.  ?Hematologic/Lymphatic:   Patient denies swollen glands and easy bruising.  ?Cardiovascular:   Patient denies leg swelling and chest pains.  ?Respiratory:   Patient denies cough and shortness of breath.  ?Endocrine:   Patient denies excessive thirst.  ?Musculoskeletal:   Patient denies back pain and joint pain.  ?Neurological:   Patient denies headaches and dizziness.  ?Psychologic:   Patient denies depression and anxiety.  ? ?VITAL SIGNS: None  ? ?MULTI-SYSTEM PHYSICAL EXAMINATION:    ?Constitutional: Well-nourished. No physical deformities. Normally developed. Good grooming.  ?Respiratory: No labored breathing, no use of accessory muscles. Clear bilaterally.  ?Cardiovascular: Normal temperature, normal extremity pulses, no swelling, no varicosities. Regular rate and rhythm.  ?Gastrointestinal: No mass, no tenderness, no rigidity, non obese abdomen.   ? ?  ?Complexity of Data:  ?Lab Test Review:   PSA  ?Records Review:   Pathology Reports, Previous Patient Records  ?X-Ray Review: PET Scan: Reviewed Films.  ?  ?Notes:                     CLINICAL DATA: High-risk prostate cancer, PSA 14.6 recent abnormal  ?MRI the prostate.  ? ?EXAM:  ?NUCLEAR MEDICINE PET SKULL BASE TO  THIGH  ? ?TECHNIQUE:  ?8.7 mCi F18 Piflufolastat (Pylarify) was injected intravenously.  ?Full-ring PET imaging was performed from the skull base to thigh  ?after the radiotracer. CT data was obtained and used for attenuation  ?correction and anatomic localization.  ? ?COMPARISON: None.  ? ?FINDINGS:  ?NECK  ? ?No radiotracer activity in neck lymph nodes.  ? ?Incidental CT finding: None  ? ?CHEST  ? ?No radiotracer accumulation within mediastinal or hilar lymph nodes.  ?No suspicious pulmonary nodules on the CT scan.  ? ?Incidental CT finding: Calcified atheromatous plaque in the thoracic  ?aorta without aneurysmal dilation. No adenopathy by size criteria in  ?the chest. Mild basilar atelectasis, lungs are otherwise clear.  ?Airways are patent.  ? ?ABDOMEN/PELVIS  ? ?Prostate: RIGHT posterolateral prostate gland near the apex with  ?marked radiotracer accumulation maximum SUV 11.8 (image 215/4)  ? ?RIGHT posterior prostate near the base with a maximum SUV of 7.9.  ? ?Lymph nodes: No abnormal radiotracer accumulation within pelvic or  ?abdominal nodes.  ? ?Liver: No evidence of liver metastasis  ? ?Incidental CT finding: Smooth hepatic contours. No acute finding  ?relative to  liver, gallbladder, pancreas, spleen or adrenal glands.  ? ?Mild perinephric stranding, a chronic finding. No hydronephrosis.  ?Urinary bladder thickening is circumferential and perhaps related to  ?mild bladder outlet obstruction in the setting of marked  ?prostatomegaly.  ? ?No signs of acute bowel process. The appendix is normal. Calcified  ?atheromatous plaque of the abdominal aorta without aneurysm.  ? ?SKELETON  ? ?Single site of radiotracer accumulation in the RIGHT posterolateral  ?sixth rib with a maximum SUV of 5.4, long segment accumulation of  ?radiotracer. Subtle ground-glass and expansile characteristics  ?without dense sclerosis. No additional sites of radiotracer  ?accumulation within visible bones.  ? ?Ovoid abdominal wall  lipoma associated with oblique musculature  ?(image 153/4) 12.7 x 8.2 cm. Areas of fat necrosis centrally and  ?some areas of mild stranding/wispy soft tissue.  ? ?Spinal degenerative changes.  ? ?IMPRESSION:

## 2022-03-01 NOTE — Progress Notes (Signed)
?Reason for the request:    Prostate cancer ? ?HPI: I was asked by Dr. Alinda Money to evaluate Jamie Gonzalez for evaluation of prostate cancer.  He is a 66 year old man with history of hyperlipidemia and BPH without any other significant conditions was found to have an elevated PSA of 14.6.  He was evaluated by Dr. Felipa Eth and underwent a prostate biopsy in November 2022.  The final pathology showed a Gleason score of 4+4 = 8 in 2 cores with 4+3 = 7 in another.  Based on these findings he underwent an MRI of the prostate and February 2013 which showed multiple suspicious lesions concerning for PI-RADS 5 lesion in the right apex with concern for extra prostatic extension.  Bone scan was also negative for metastatic disease.  He underwent a PSMA PET in March 2023.  Which showed no evidence of metastatic disease although he had accumulation in 2 discrete areas of the prostate gland.  The right posterior sixth rib showed activity with differential that includes fibrous dysplasia.  Clinically, he reports feeling well without any complaints.  He does report nocturia and dribbling.  He denies any urinary frequency or urgency or hesitancy. ? ? He does not report any headaches, blurry vision, syncope or seizures. Does not report any fevers, chills or sweats.  Does not report any cough, wheezing or hemoptysis.  Does not report any chest pain, palpitation, orthopnea or leg edema.  Does not report any nausea, vomiting or abdominal pain.  Does not report any constipation or diarrhea.  Does not report any skeletal complaints.    Does not report frequency, urgency or hematuria.  Does not report any skin rashes or lesions. Does not report any heat or cold intolerance.  Does not report any lymphadenopathy or petechiae.  Does not report any anxiety or depression.  Remaining review of systems is negative.  ? ? ? ?Past Medical History:  ?Diagnosis Date  ? Hypertension   ? Tubular adenoma   ?: ? ? ?Past Surgical History:  ?Procedure  Laterality Date  ? COLONOSCOPY  05/22/2009  ? Dr.Rourk- normal rectum, polyps at the splenic flexure and sigmoid, the remainder of the colonic mucosa appeared normal. bx= tubular adenoma  ? COLONOSCOPY N/A 08/19/2014  ? Procedure: COLONOSCOPY;  Surgeon: Daneil Dolin, MD;  [facility];  Service: Endoscopy;  Laterality: N/A;  1130  ?: ? ? ?Current Outpatient Medications:  ?  rosuvastatin (CRESTOR) 10 MG tablet, Take 10 mg by mouth daily., Disp: , Rfl:  ?  tamsulosin (FLOMAX) 0.4 MG CAPS capsule, Take 0.4 mg by mouth daily after breakfast., Disp: , Rfl:  ?  diphenhydrAMINE (BENADRYL) 25 MG tablet, Take 25 mg by mouth daily as needed for itching. (Patient not taking: Reported on 03/01/2022), Disp: , Rfl: : ? ?No Known Allergies: ? ?No family history on file.: ? ? ?Social History  ? ?Socioeconomic History  ? Marital status: Married  ?  Spouse name: Not on file  ? Number of children: Not on file  ? Years of education: Not on file  ? Highest education level: Not on file  ?Occupational History  ? Not on file  ?Tobacco Use  ? Smoking status: Every Day  ?  Packs/day: 0.50  ?  Types: Cigarettes  ? Smokeless tobacco: Never  ? Tobacco comments:  ?  1/2 pack daily  ?Vaping Use  ? Vaping Use: Never used  ?Substance and Sexual Activity  ? Alcohol use: Not Currently  ? Drug use: Never  ?  Sexual activity: Not on file  ?Other Topics Concern  ? Not on file  ?Social History Narrative  ? Not on file  ? ?Social Determinants of Health  ? ?Financial Resource Strain: Not on file  ?Food Insecurity: Not on file  ?Transportation Needs: Not on file  ?Physical Activity: Not on file  ?Stress: Not on file  ?Social Connections: Not on file  ?Intimate Partner Violence: Not on file  ?: ? ? ? ?NM PET (PSMA) SKULL TO MID THIGH ? ?Result Date: 02/01/2022 ?CLINICAL DATA:  High-risk prostate cancer, PSA 14.6 recent abnormal MRI the prostate. EXAM: NUCLEAR MEDICINE PET SKULL BASE TO THIGH TECHNIQUE: 8.7 mCi F18 Piflufolastat (Pylarify) was  injected intravenously. Full-ring PET imaging was performed from the skull base to thigh after the radiotracer. CT data was obtained and used for attenuation correction and anatomic localization. COMPARISON:  None. FINDINGS: NECK No radiotracer activity in neck lymph nodes. Incidental CT finding: None CHEST No radiotracer accumulation within mediastinal or hilar lymph nodes. No suspicious pulmonary nodules on the CT scan. Incidental CT finding: Calcified atheromatous plaque in the thoracic aorta without aneurysmal dilation. No adenopathy by size criteria in the chest. Mild basilar atelectasis, lungs are otherwise clear. Airways are patent. ABDOMEN/PELVIS Prostate: RIGHT posterolateral prostate gland near the apex with marked radiotracer accumulation maximum SUV 11.8 (image 215/4) RIGHT posterior prostate near the base with a maximum SUV of 7.9. Lymph nodes: No abnormal radiotracer accumulation within pelvic or abdominal nodes. Liver: No evidence of liver metastasis Incidental CT finding: Smooth hepatic contours. No acute finding relative to liver, gallbladder, pancreas, spleen or adrenal glands. Mild perinephric stranding, a chronic finding. No hydronephrosis. Urinary bladder thickening is circumferential and perhaps related to mild bladder outlet obstruction in the setting of marked prostatomegaly. No signs of acute bowel process. The appendix is normal. Calcified atheromatous plaque of the abdominal aorta without aneurysm. SKELETON Single site of radiotracer accumulation in the RIGHT posterolateral sixth rib with a maximum SUV of 5.4, long segment accumulation of radiotracer. Subtle ground-glass and expansile characteristics without dense sclerosis. No additional sites of radiotracer accumulation within visible bones. Ovoid abdominal wall lipoma associated with oblique musculature (image 153/4) 12.7 x 8.2 cm. Areas of fat necrosis centrally and some areas of mild stranding/wispy soft tissue. Spinal degenerative  changes. IMPRESSION: Radiotracer accumulation at 2 discrete sites in the prostate gland. No signs of nodal or hepatic metastases. 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 Lipomatous lesion along the RIGHT flank with signs of internal fat necrosis. Mild complexity could be seen in the setting of mildly atypical lipomatous lesion favored to be benign. Attention on follow-up or comparison with prior imaging is suggested. Prostatomegaly. Aortic atherosclerosis. Electronically Signed   By: Zetta Bills M.D.   On: 02/01/2022 17:12   ? ?Assessment and Plan:  ? ?66 year old with: ? ?1.  Prostate cancer diagnosed and November 2022.  He was found to have a Gleason score 8 and a PSA of 14.6.  He has disease with extraprostatic extension as evident by MRI as well as PSMA PET scan. ? ?His case was discussed today in the prostate cancer multidisciplinary clinic and treatment choices were reviewed.  Primary surgical therapy as a part of the trimodality approach could necessitate the need for salvage radiation therapy and possible androgen deprivation were discussed.  Radiation therapy with androgen deprivation would also be an alternative option as a primary modality. ? ?  Risks and benefits of all these approaches were discussed.  We also reviewed the role for systemic therapy in this particular setting which is deferred beyond androgen depravation.  Salvage therapy options include systemic chemotherapy among others were also reviewed if he has metastatic disease. ? ?After discussion today, he is favoring primary surgical therapy and is currently scheduled in May 2023. ? ?2.  Abnormal sixth rib lesion: This was discussed today in detail and imaging studies were reviewed.  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   At this time, I do not feel strongly about obtaining tissue biopsy and I would treat the primary prostate cancer accordingly.  Repeat imaging studies would be recommended to ensure stability. ? ?45  minutes were de

## 2022-03-02 ENCOUNTER — Encounter: Payer: Self-pay | Admitting: Genetic Counselor

## 2022-03-02 NOTE — Progress Notes (Addendum)
Anesthesia Review: ? ?PCP: [place] in [place]  ?Cardiologist : none  ?Chest x-ray : ?EKG : ?Echo : ?Stress test: ?Cardiac Cath :  ?Activity level: can do a flight of stairs without difficulty  ?Sleep Study/ CPAP : none  ?Fasting Blood Sugar :      / Checks Blood Sugar -- times a day:   ?Blood Thinner/ Instructions /Last Dose: ?ASA / Instructions/ Last Dose :   ?

## 2022-03-02 NOTE — Progress Notes (Signed)
REFERRING PROVIDER: ?Zola Button, MD ?[facility] ?[place], [place] [address] ? ?PRIMARY PROVIDER:  ?[place], [facility] ? ?PRIMARY REASON FOR VISIT:  ?1. Prostate cancer ([place]); high risk; Gleason group 3,4; PSA 14.8   ? ?HISTORY OF PRESENT ILLNESS:   ?Jamie Gonzalez, a 66 y.o. male, was seen for a [facility] cancer genetics consultation at the request of Dr. Alen Blew due to a personal and family history of cancer.  Jamie Gonzalez presents to clinic today to discuss the possibility of a hereditary predisposition to cancer, to discuss genetic testing, and to further clarify his future cancer risks, as well as potential cancer risks for family members.  ? ?In November 2022, at the age of 73, Jamie Gonzalez was diagnosed with high risk prostate cancer (Gleason 4+4=8).  ? ?CANCER HISTORY:  ?Oncology History  ?Prostate cancer [facility]); high risk; Gleason group 3,4; PSA 14.8  ?09/23/2021 Initial Diagnosis  ? Prostate cancer [facility]); high risk; Gleason group 3,4; PSA 14.8 ? ?  ?03/01/2022 Cancer Staging  ? Staging form: Prostate, AJCC 8th Edition ?- Clinical: Stage IIIB (cT3, cN0, cM0, PSA: 14.6, Grade Group: 4) - Signed by Wyatt Portela, MD on 03/01/2022 ?Prostate specific antigen (PSA) range: 10 to 19 ?Gleason score: 8 ?Histologic grading system: 5 grade system ? ?  ? ? ?Past Medical History:  ?Diagnosis Date  ? Hypertension   ? Tubular adenoma   ? ? ?Past Surgical History:  ?Procedure Laterality Date  ? COLONOSCOPY  05/22/2009  ? Dr.Rourk- normal rectum, polyps at the splenic flexure and sigmoid, the remainder of the colonic mucosa appeared normal. bx= tubular adenoma  ? COLONOSCOPY N/A 08/19/2014  ? Procedure: COLONOSCOPY;  Surgeon: Daneil Dolin, MD;  [facility];  Service: Endoscopy;  Laterality: N/A;  1130  ? ? ?Social History  ? ?Socioeconomic History  ? Marital status: Married  ?  Spouse name: Not on file  ? Number of children: Not on file  ? Years of education: Not on file  ? Highest  education level: Not on file  ?Occupational History  ? Not on file  ?Tobacco Use  ? Smoking status: Every Day  ?  Packs/day: 0.50  ?  Types: Cigarettes  ? Smokeless tobacco: Never  ? Tobacco comments:  ?  1/2 pack daily  ?Vaping Use  ? Vaping Use: Never used  ?Substance and Sexual Activity  ? Alcohol use: Not Currently  ? Drug use: Never  ? Sexual activity: Not on file  ?Other Topics Concern  ? Not on file  ?Social History Narrative  ? Not on file  ? ?Social Determinants of Health  ? ?Financial Resource Strain: Not on file  ?Food Insecurity: Not on file  ?Transportation Needs: Not on file  ?Physical Activity: Not on file  ?Stress: Not on file  ?Social Connections: Not on file  ?  ? ?FAMILY HISTORY:  ?We obtained a detailed, 4-generation family history.  Significant diagnoses are listed below: ?Family History  ?Problem Relation Age of Onset  ? Cancer Maternal Aunt   ? Breast cancer Niece 63  ? Cancer Cousin   ?     stomach or peritoneal cancer  ? ? ? ? ? ?Jamie Gonzalez has a niece who was recently diagnosed with breast cancer at age 59 and she had a double mastectomy as part of her treatment. A maternal aunt was diagnosed with an unknown cancer, she is deceased. A male paternal first cousin was recently diagnosed with either stomach or  peritoneal cancer. Jamie Gonzalez is unaware of previous family history of genetic testing for hereditary cancer risks. There is no reported Ashkenazi Jewish ancestry.  ? ?GENETIC COUNSELING ASSESSMENT: Jamie Gonzalez is a 66 y.o. male with a personal and family history of cancer which is somewhat suggestive of a hereditary predisposition to cancer given his personal history of high risk prostate cancer. We, therefore, discussed and recommended the following at today's visit.  ? ?DISCUSSION: We discussed that 5 - 10% of cancer is hereditary, with most cases of prostate cancer associated with BRCA1/2 or HOXB13.  There are other genes that can be associated with hereditary prostate cancer  syndromes.  We discussed that testing is beneficial for several reasons including knowing how to follow individuals after completing their treatment, identifying whether potential treatment options would be beneficial, and understanding if other family members could be at risk for cancer and allowing them to undergo genetic testing.  ? ?We reviewed the characteristics, features and inheritance patterns of hereditary cancer syndromes. We also discussed genetic testing, including the appropriate family members to test, the process of testing, insurance coverage and turn-around-time for results. We discussed the implications of a negative, positive, carrier and/or variant of uncertain significant result. We recommended Jamie Gonzalez pursue genetic testing for a panel that includes genes associated with prostate and breast cancer.  ? ?Jamie Gonzalez elected to have Jamie Gonzalez. The CancerNext gene panel offered by [facility] includes sequencing, rearrangement analysis, and RNA analysis for the following 36 genes:   APC, ATM, AXIN2, BARD1, BMPR1A, BRCA1, BRCA2, BRIP1, CDH1, CDK4, CDKN2A, CHEK2, DICER1, HOXB13, EPCAM, GREM1, MLH1, MSH2, MSH3, MSH6, MUTYH, NBN, NF1, NTHL1, PALB2, PMS2, POLD1, POLE, PTEN, RAD51C, RAD51D, RECQL, SMAD4, SMARCA4, STK11, and TP53.  ? ?Based on Jamie Gonzalez personal and family history of cancer, he meets medical criteria for genetic testing. Despite that he meets criteria, he may still have an out of pocket cost. We discussed that if his out of pocket cost for testing is over $100, the laboratory will call and confirm whether he wants to proceed with testing.  If the out of pocket cost of testing is less than $100 he will be billed by the genetic testing laboratory.  ? ?PLAN: After considering the risks, benefits, and limitations, Jamie Gonzalez provided informed consent to pursue genetic testing and the blood sample was sent to [facility] for analysis of the CancerNext  Panel. Results should be available within approximately 2-3 weeks' time, at which point they will be disclosed by telephone to Jamie Gonzalez, as will any additional recommendations warranted by these results. Jamie Gonzalez will receive a summary of his genetic counseling visit and a copy of his results once available. This information will also be available in Epic.  ? ?Jamie Gonzalez questions were answered to his satisfaction today. Our contact information was provided should additional questions or concerns arise. Thank you for the referral and allowing [place] to share in the care of your patient.  ? ?Lucille Passy, MS, [place] ?Genetic Counselor ?[email or web address] ?(P) [phone] ? ?The patient was seen for a total of 20 minutes in face-to-face genetic counseling.  The patient brought his wife.  Drs. Lindi Adie and/or Burr Medico were available to discuss this case as needed.  ? ?_______________________________________________________________________ ?For Office Staff:  ?Number of people involved in session: 2 ?Was an Intern/ student involved with case: no ? ?

## 2022-03-02 NOTE — Progress Notes (Signed)
DUE TO COVID-19 ONLY  2 VISITOR IS ALLOWED TO COME WITH YOU AND STAY IN THE WAITING ROOM ONLY DURING PRE OP AND PROCEDURE DAY OF SURGERY.   4 VISITOR  MAY VISIT WITH YOU AFTER SURGERY IN YOUR PRIVATE ROOM DURING VISITING HOURS ONLY! ?YOU MAY HAVE ONE PERSON SPEND THE NITE WITH YOU IN YOUR ROOM AFTER SURGERY.   ? ?.  ? ? Your procedure is scheduled on:  ? 03/17/2022  ? Report to [facility] Main  Entrance ? ? Report to admitting at  0515              AM ?DO NOT [place], PICTURE ID OR WALLET DAY OF SURGERY.  ?  ? ? Call this number if you have problems the morning of surgery [phone]  ? ?Clear liquid diet the day before surgery.  Mix 17g of Miralax in 4 ounces of water and dirnk at 12 noon day before surgery.   ?Fleets enema nite before surgery.   ? ? REMEMBER: NO  SOLID FOODS , CANDY, GUM OR MINTS AFTER MIDNITE THE NITE BEFORE SURGERY .       Marland Kitchen CLEAR LIQUIDS UNTIL       0415am          DAY OF SURGERY.      ? ? ?CLEAR LIQUID DIET ? ? ?Foods Allowed      ?WATER ?BLACK COFFEE ( SUGAR OK, NO MILK, CREAM OR CREAMER) REGULAR AND DECAF  ?TEA ( SUGAR OK NO MILK, CREAM, OR CREAMER) REGULAR AND DECAF  ?PLAIN JELLO ( NO RED)  ?FRUIT ICES ( NO RED, NO FRUIT PULP)  ?POPSICLES ( NO RED)  ?JUICE- APPLE, WHITE GRAPE AND WHITE CRANBERRY  ?SPORT DRINK LIKE GATORADE ( NO RED)  ?CLEAR BROTH ( VEGETABLE , CHICKEN OR BEEF)                                                               ? ?    ? ?BRUSH YOUR TEETH MORNING OF SURGERY AND RINSE YOUR MOUTH OUT, NO CHEWING GUM CANDY OR MINTS. ?  ? ? Take these medicines the morning of surgery with A SIP OF WATER:  flomax  ? ? ?DO NOT TAKE ANY DIABETIC MEDICATIONS DAY OF YOUR SURGERY ?                  ?            You may not have any metal on your body including hair pins and  ?            piercings  Do not wear jewelry, make-up, lotions, powders or perfumes, deodorant ?            Do not wear nail polish on your fingernails.   ?           IF YOU ARE A MALE AND WANT TO  SHAVE UNDER ARMS OR LEGS PRIOR TO SURGERY YOU MUST DO SO AT LEAST 48 HOURS PRIOR TO SURGERY.  ?            Men may shave face and neck. ? ? Do not bring valuables to the hospital. [place] NOT ?  RESPONSIBLE   FOR VALUABLES. ? Contacts, dentures or bridgework may not be worn into surgery. ? Leave suitcase in the car. After surgery it may be brought to your room. ? ?  ? Patients discharged the day of surgery will not be allowed to drive home. IF YOU ARE HAVING SURGERY AND GOING HOME THE SAME DAY, YOU MUST HAVE AN ADULT TO DRIVE YOU HOME AND BE WITH YOU FOR 24 HOURS. YOU MAY GO HOME BY TAXI OR UBER OR ORTHERWISE, BUT AN ADULT MUST ACCOMPANY YOU HOME AND STAY WITH YOU FOR 24 HOURS. ?  ? ?            Please read over the following fact sheets you were given: ?_____________________________________________________________________ ? ?[place] - Preparing for Surgery ?Before surgery, you can play an important role.  Because skin is not sterile, your skin needs to be as free of germs as possible.  You can reduce the number of germs on your skin by washing with CHG (chlorahexidine gluconate) soap before surgery.  CHG is an antiseptic cleaner which kills germs and bonds with the skin to continue killing germs even after washing. ?Please DO NOT use if you have an allergy to CHG or antibacterial soaps.  If your skin becomes reddened/irritated stop using the CHG and inform your nurse when you arrive at Short Stay. ?Do not shave (including legs and underarms) for at least 48 hours prior to the first CHG shower.  You may shave your face/neck. ?Please follow these instructions carefully: ? 1.  Shower with CHG Soap the night before surgery and the  morning of Surgery. ? 2.  If you choose to wash your hair, wash your hair first as usual with your  normal  shampoo. ? 3.  After you shampoo, rinse your hair and body thoroughly to remove the  shampoo.                           4.  Use CHG as you would any other liquid  soap.  You can apply chg directly  to the skin and wash  ?                     Gently with a scrungie or clean washcloth. ? 5.  Apply the CHG Soap to your body ONLY FROM THE NECK DOWN.   Do not use on face/ open      ?                     Wound or open sores. Avoid contact with eyes, ears mouth and genitals (private parts).  ?                     Production manager,  Genitals (private parts) with your normal soap. ?            6.  Wash thoroughly, paying special attention to the area where your surgery  will be performed. ? 7.  Thoroughly rinse your body with warm water from the neck down. ? 8.  DO NOT shower/wash with your normal soap after using and rinsing off  the CHG Soap. ?               9.  Pat yourself dry with a clean towel. ?           10.  Wear clean pajamas. ?  11.  Place clean sheets on your bed the night of your first shower and do not  sleep with pets. ?Day of Surgery : ?Do not apply any lotions/deodorants the morning of surgery.  Please wear clean clothes to the hospital/surgery center. ? ?FAILURE TO FOLLOW THESE INSTRUCTIONS MAY RESULT IN THE CANCELLATION OF YOUR SURGERY ?PATIENT SIGNATURE_________________________________ ? ?NURSE SIGNATURE__________________________________ ? ?________________________________________________________________________  ? ? ?           ?

## 2022-03-04 ENCOUNTER — Encounter (HOSPITAL_COMMUNITY)
Admission: RE | Admit: 2022-03-04 | Discharge: 2022-03-04 | Disposition: A | Discharge status: Home / Self Care | Payer: Medicare HMO | Source: Ambulatory Visit | Attending: Urology | Admitting: Urology

## 2022-03-04 ENCOUNTER — Encounter (HOSPITAL_COMMUNITY): Payer: Self-pay

## 2022-03-04 ENCOUNTER — Other Ambulatory Visit: Payer: Self-pay

## 2022-03-04 VITALS — BP 136/86 | HR 76 | Temp 98.2°F | Resp 16 | Ht 72.0 in | Wt 236.0 lb

## 2022-03-04 DIAGNOSIS — C61 Malignant neoplasm of prostate: Secondary | ICD-10-CM | POA: Insufficient documentation

## 2022-03-04 DIAGNOSIS — Z01812 Encounter for preprocedural laboratory examination: Secondary | ICD-10-CM | POA: Diagnosis not present

## 2022-03-04 HISTORY — DX: Malignant (primary) neoplasm, unspecified: C80.1

## 2022-03-04 LAB — CBC
HCT: 39.3 % (ref 39.0–52.0)
Hemoglobin: 13.9 g/dL (ref 13.0–17.0)
MCH: 30.5 pg (ref 26.0–34.0)
MCHC: 35.4 g/dL (ref 30.0–36.0)
MCV: 86.4 fL (ref 80.0–100.0)
Platelets: 147 10*3/uL — ABNORMAL LOW (ref 150–400)
RBC: 4.55 MIL/uL (ref 4.22–5.81)
RDW: 15.5 % (ref 11.5–15.5)
WBC: 7 10*3/uL (ref 4.0–10.5)
nRBC: 0 % (ref 0.0–0.2)

## 2022-03-04 LAB — BASIC METABOLIC PANEL
Anion gap: 5 (ref 5–15)
BUN: 10 mg/dL (ref 8–23)
CO2: 26 mmol/L (ref 22–32)
Calcium: 8.6 mg/dL — ABNORMAL LOW (ref 8.9–10.3)
Chloride: 109 mmol/L (ref 98–111)
Creatinine, Ser: 0.91 mg/dL (ref 0.61–1.24)
GFR, Estimated: >60 mL/min (ref >60)
Glucose, Bld: 112 mg/dL — ABNORMAL HIGH (ref 70–99)
Potassium: 3.5 mmol/L (ref 3.5–5.1)
Sodium: 140 mmol/L (ref 135–145)

## 2022-03-08 DIAGNOSIS — N393 Stress incontinence (female) (male): Secondary | ICD-10-CM | POA: Diagnosis not present

## 2022-03-08 DIAGNOSIS — C61 Malignant neoplasm of prostate: Secondary | ICD-10-CM | POA: Diagnosis not present

## 2022-03-08 DIAGNOSIS — M6281 Muscle weakness (generalized): Secondary | ICD-10-CM | POA: Diagnosis not present

## 2022-03-08 DIAGNOSIS — M62838 Other muscle spasm: Secondary | ICD-10-CM | POA: Diagnosis not present

## 2022-03-16 NOTE — Anesthesia Preprocedure Evaluation (Addendum)
Anesthesia Evaluation  ?Patient identified by MRN, date of birth, ID band ?Patient awake ? ? ? ?Reviewed: ?Allergy & Precautions, NPO status , Patient's Chart, lab work & pertinent test results ? ?History of Anesthesia Complications ?Negative for: history of anesthetic complications ? ?Airway ?Mallampati: III ? ?TM Distance: >3 FB ?Neck ROM: Full ? ? ? Dental ?no notable dental hx. ? ?  ?Pulmonary ?Current Smoker,  ?  ?Pulmonary exam normal ? ? ? ? ? ? ? Cardiovascular ?negative cardio ROS ?Normal cardiovascular exam ? ? ?  ?Neuro/Psych ?negative neurological ROS ? negative psych ROS  ? GI/Hepatic ?negative GI ROS, Neg liver ROS,   ?Endo/Other  ?negative endocrine ROS ? Renal/GU ?negative Renal ROS  ? ?  ?Musculoskeletal ?negative musculoskeletal ROS ?(+)  ? Abdominal ?  ?Peds ? Hematology ?negative hematology ROS ?(+)   ?Anesthesia Other Findings ?Prostate ca ? Reproductive/Obstetrics ?negative OB ROS ? ?  ? ? ? ? ? ? ? ? ? ? ? ? ? ?  ?  ? ? ? ? ? ? ? ?Anesthesia Physical ?Anesthesia Plan ? ?ASA: 2 ? ?Anesthesia Plan: General  ? ?Post-op Pain Management: Tylenol PO (pre-op)*  ? ?Induction: Intravenous ? ?PONV Risk Score and Plan: 2 and Treatment may vary due to age or medical condition, Ondansetron, Dexamethasone and Midazolam ? ?Airway Management Planned: Oral ETT ? ?Additional Equipment: None ? ?Intra-op Plan:  ? ?Post-operative Plan: Extubation in OR ? ?Informed Consent: I have reviewed the patients History and Physical, chart, labs and discussed the procedure including the risks, benefits and alternatives for the proposed anesthesia with the patient or authorized representative who has indicated his/her understanding and acceptance.  ? ? ? ?Dental advisory given ? ?Plan Discussed with: CRNA ? ?Anesthesia Plan Comments: (PIV x2)  ? ? ? ? ? ?Anesthesia Quick Evaluation ? ?

## 2022-03-16 NOTE — H&P (Signed)
? ? ?Office Visit Report     03/08/2022  ? ?-------------------------------------------------------------------------------- ?  ?Jamie Gonzalez  ?MRN: [id number]  ?DOB: [birth year], 66 year old Male  ?SSN:   ? PRIMARY CARE:  Jamie Sites, MD  ?REFERRING:  Jamie Gonzalez, Jamie Gonzalez  ?PROVIDER:  Raynelle Gonzalez, M.D.  ?TREATING:  Jamie Rossetti, PA  ?LOCATION:  [facility] [phone]  ?  ? ?-------------------------------------------------------------------------------- ?  ?CC/HPI: Pt presents today for pre-operative history and physical exam in anticipation of robotic assisted lap radical prostatectomy with bilateral pelvic lymph node dissection by Dr. Alinda Gonzalez on 03/17/22. He is doing well and is without complaint.  ? ?Pt denies F/C, HA, CP, SOB, N/V, diarrhea/constipation, back pain, flank pain, hematuria, and dysuria.  ? ? ?HX:  ? ?CC: Prostate Cancer  ? ?Physician requesting consult: Dr. Joelene Gonzalez  ?PCP: Dr. Sharilyn Gonzalez  ?Location of consultation: [place]  ? ?Jamie Gonzalez is a 66 year old gentleman who was evaluated by Jamie Gonzalez for an elevated PSA of 14.6. He underwent a TRUS biopsy of the prostate on 09/16/21 that demonstrated Gleason 4+4=8 adenocarcinoma of the prostate with 2 out of 12 biopsy cores positive for malignancy. He had a bone scan on 11/24/21 that was negative for metastatic disease. He had an MRI of the prostate on 12/21/21 indicating multiple suspicious lesions with the most concerning PI-RADS 5 lesion at the right apex with concern for EPE in this area. He saw me in mid March for an initial consultation and due to his BPH and response to medical therapy for BPH tentatively had elected surgical treatment. I recommended a PSMA PET scan due to his high risk disease. This was performed on 01/31/22 and demonstrated concern for a solitary metastatic right rib lesion. He presents today for multidisciplinary evaluation and discussion. He is currently tentatively  scheduled for surgery in early/mid May.  ? ?He has a history of BPH and takes tamsulosin.  ? ?He was noted to have 3-10 RBCs at his initial evaluation with Jamie Gonzalez. CT imaging and cystoscopy were negative for concerning pathology. He does have a history of smoking.  ? ?Family history: None.  ? ?Imaging studies:  ?Bone scan (11/24/21): No definite metastatic disease. However, he did undergo additional imaging with multiple MRIs of both the thoracic and cervical spine. This demonstrated degenerative changes with some indeterminate lesions.  ?MRI (12/21/21): No SVI, LAD, or bone lesions. 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  ?PSMA PET scan (01/31/22): Isolated uptake at right posterior 6th rib concerning for solitary metastatic lesion.  ? ? ?PMH: He has a history of hyperlipidemia.  ?PSH: No abdominal surgeries.  ? ?TNM stage: cT3a N0 M0  ?PSA: 14.6  ?Gleason score: 4+4=8 (GG 4)  ?Biopsy (09/16/22): 2/12 cores positive  ?Left: Benign  ?Right: R apex (40%, 4+4=8), R lateral apex (70%, 4+3=7)  ?Prostate volume: 123.9 cc  ?PSAD: 0.11  ? ?Nomogram  ?OC disease: 13%  ?EPE: 85%  ?SVI: 19%  ?LNI: 28%  ?PFS (5 year, 10 year): 24%, 14%  ? ?Urinary function: IPSS is 9. He has been taking tamsulosin since the fall and has found this to be helpful.  ?Erectile function: SHIM score is 17. He can obtain erections adequate for intercourse most of the time but does find it to be more difficult at times versus others. He has not been treated with medication.  ? ?  ?ALLERGIES: None  ? ?MEDICATIONS: Tamsulosin Hcl 0.4 mg capsule  ?Rosuvastatin Calcium  ?  ? ?  GU PSH: Cystoscopy - 01/18/2022 ?Locm 300-'399Mg'$ /Ml Iodine,1Ml - 01/21/2022 ?Vasectomy ? ?  ? ?NON-GU PSH: None  ? ?GU PMH: Prostate Cancer - 03/01/2022, - 01/18/2022 ?Stress Incontinence - 02/16/2022 ?History of prostate cancer - 02/01/2022 ?Microscopic hematuria - 01/21/2022, - 01/18/2022 ?  ? ?NON-GU PMH: Muscle weakness (generalized) - 02/16/2022, - 02/01/2022 ?Other muscle spasm - 02/16/2022, -  02/01/2022 ?Hypercholesterolemia ?  ? ?FAMILY HISTORY: 2 sons - Runs in Family ?Congestive Heart Failure - Mother ?Diabetes - Mother  ? ?SOCIAL HISTORY: Marital Status: Married ?Preferred Language: [place]; Ethnicity: Not Hispanic Or Latino; Race: Black or African American ?Current Smoking Status: Patient smokes.  ? ?Tobacco Use Assessment Completed: Used Tobacco in last 30 days? ?Does not use smokeless tobacco. ?Does drink.  ?Does not use drugs. ?Does not drink caffeine. ?Has not had a blood transfusion. ?  ?  Notes: Smokes 1 pack about every 2 days  ?ETOH once per week   ? ?REVIEW OF SYSTEMS:    ?GU Review Male:   Patient reports get up at night to urinate and have to strain to urinate . Patient denies frequent urination, hard to postpone urination, burning/ pain with urination, leakage of urine, stream starts and stops, trouble starting your stream, erection problems, and penile pain.  ?Gastrointestinal (Upper):   Patient denies nausea, vomiting, and indigestion/ heartburn.  ?Gastrointestinal (Lower):   Patient denies diarrhea and constipation.  ?Constitutional:   Patient denies fever, night sweats, weight loss, and fatigue.  ?Skin:   Patient denies itching and skin rash/ lesion.  ?Eyes:   Patient denies blurred vision and double vision.  ?Ears/ Nose/ Throat:   Patient denies sore throat and sinus problems.  ?Hematologic/Lymphatic:   Patient denies swollen glands and easy bruising.  ?Cardiovascular:   Patient denies leg swelling and chest pains.  ?Respiratory:   Patient denies cough and shortness of breath.  ?Endocrine:   Patient denies excessive thirst.  ?Musculoskeletal:   Patient denies back pain and joint pain.  ?Neurological:   Patient denies headaches and dizziness.  ?Psychologic:   Patient denies depression and anxiety.  ? ?VITAL SIGNS:    ?  03/08/2022 03:21 PM  ?Weight 236 lb / 107.05 kg  ?Height 72 in / 182.88 cm  ?BP 133/88 mmHg  ?Heart Rate 89 /min  ?Temperature 98.0 F / 36.6 C  ?BMI 32.0 kg/m?   ? ?MULTI-SYSTEM PHYSICAL EXAMINATION:    ?Constitutional: Well-nourished. No physical deformities. Normally developed. Good grooming.  ?Neck: Neck symmetrical, not swollen. Normal tracheal position.  ?Respiratory: Normal breath sounds. No labored breathing, no use of accessory muscles.   ?Cardiovascular: Regular rate and rhythm. No murmur, no gallop.   ?Lymphatic: No enlargement of neck, axillae, groin.  ?Skin: No paleness, no jaundice, no cyanosis. No lesion, no ulcer, no rash.  ?Neurologic / Psychiatric: Oriented to time, oriented to place, oriented to person. No depression, no anxiety, no agitation.  ?Gastrointestinal: No mass, no tenderness, no rigidity, obese abdomen.   ?Eyes: Normal conjunctivae. Normal eyelids.  ?Ears, Nose, Mouth, and Throat: Left ear no scars, no lesions, no masses. Right ear no scars, no lesions, no masses. Nose no scars, no lesions, no masses. Normal hearing. Normal lips.  ?Musculoskeletal: Normal gait and station of head and neck.  ? ?  ?Complexity of Data:  ?Records Review:   Previous Patient Records  ?Urine Test Review:   Urinalysis  ? 03/08/22  ?Urinalysis  ?Urine Appearance Clear   ?Urine Color Yellow   ?Urine Glucose Neg mg/dL  ?Urine  Bilirubin Neg mg/dL  ?Urine Ketones Neg mg/dL  ?Urine Specific Gravity 1.015   ?Urine Blood Neg ery/uL  ?Urine pH 6.0   ?Urine Protein Neg mg/dL  ?Urine Urobilinogen 0.2 mg/dL  ?Urine Nitrites Neg   ?Urine Leukocyte Esterase Neg leu/uL  ? ?PROCEDURES:    ? ?     Urinalysis - 81003 ?Dipstick Dipstick Cont'd  ?Color: Yellow Bilirubin: Neg mg/dL  ?Appearance: Clear Ketones: Neg mg/dL  ?Specific Gravity: 1.015 Blood: Neg ery/uL  ?pH: 6.0 Protein: Neg mg/dL  ?Glucose: Neg mg/dL Urobilinogen: 0.2 mg/dL  ?  Nitrites: Neg  ?  Leukocyte Esterase: Neg leu/uL  ? ? ?ASSESSMENT:  ?    ICD-10 Details  ?1 GU:   Prostate Cancer - C61   ? ?PLAN:    ? ?      Schedule ?Return Visit/Planned Activity: Keep Scheduled Appointment - Schedule Surgery  ? ? ?       Document ?Letter(s):  Created for Patient: Clinical Summary  ? ? ?     Notes:   There are no changes in the patients history or physical exam since last evaluation by Dr. Alinda Gonzalez. Pt is scheduled to undergo RALP with BPLND on 5/11

## 2022-03-17 ENCOUNTER — Other Ambulatory Visit: Payer: Self-pay

## 2022-03-17 ENCOUNTER — Encounter (HOSPITAL_COMMUNITY)
Admission: RE | Disposition: A | Discharge status: Home / Self Care | Payer: Self-pay | Source: Ambulatory Visit | Attending: Urology

## 2022-03-17 ENCOUNTER — Observation Stay (HOSPITAL_COMMUNITY)
Admission: RE | Admit: 2022-03-17 | Discharge: 2022-03-18 | Disposition: A | Discharge status: Home / Self Care | Payer: Medicare HMO | Source: Ambulatory Visit | Attending: Urology | Admitting: Urology

## 2022-03-17 ENCOUNTER — Ambulatory Visit (HOSPITAL_COMMUNITY): Payer: Medicare HMO | Admitting: Certified Registered"

## 2022-03-17 ENCOUNTER — Observation Stay (HOSPITAL_COMMUNITY): Payer: Medicare HMO

## 2022-03-17 ENCOUNTER — Encounter (HOSPITAL_COMMUNITY): Payer: Self-pay | Admitting: Urology

## 2022-03-17 ENCOUNTER — Ambulatory Visit (HOSPITAL_BASED_OUTPATIENT_CLINIC_OR_DEPARTMENT_OTHER): Payer: Medicare HMO | Admitting: Certified Registered"

## 2022-03-17 DIAGNOSIS — C61 Malignant neoplasm of prostate: Secondary | ICD-10-CM

## 2022-03-17 DIAGNOSIS — Z466 Encounter for fitting and adjustment of urinary device: Secondary | ICD-10-CM | POA: Diagnosis not present

## 2022-03-17 DIAGNOSIS — Z79899 Other long term (current) drug therapy: Secondary | ICD-10-CM | POA: Diagnosis not present

## 2022-03-17 DIAGNOSIS — F17210 Nicotine dependence, cigarettes, uncomplicated: Secondary | ICD-10-CM | POA: Insufficient documentation

## 2022-03-17 DIAGNOSIS — D179 Benign lipomatous neoplasm, unspecified: Secondary | ICD-10-CM | POA: Diagnosis not present

## 2022-03-17 HISTORY — PX: LYMPHADENECTOMY: SHX5960

## 2022-03-17 HISTORY — PX: ROBOT ASSISTED LAPAROSCOPIC RADICAL PROSTATECTOMY: SHX5141

## 2022-03-17 LAB — TYPE AND SCREEN
ABO/RH(D): B NEG
Antibody Screen: POSITIVE
Unit division: 0
Unit division: 0

## 2022-03-17 LAB — BPAM RBC
Blood Product Expiration Date: 202305162359
Blood Product Expiration Date: 202306022359
Unit Type and Rh: 1700
Unit Type and Rh: 1700

## 2022-03-17 LAB — HEMOGLOBIN AND HEMATOCRIT, BLOOD
HCT: 39.7 % (ref 39.0–52.0)
Hemoglobin: 13.2 g/dL (ref 13.0–17.0)

## 2022-03-17 IMAGING — DX DG ABDOMEN 1V
2 series · 2 of 2 positions shown · non-contrast
Comparison: PET-CT dated [DATE]

CLINICAL DATA: Right ureter stent placement

EXAM:
ABDOMEN - 1 VIEW

[abdomen kub (1 of 2)]
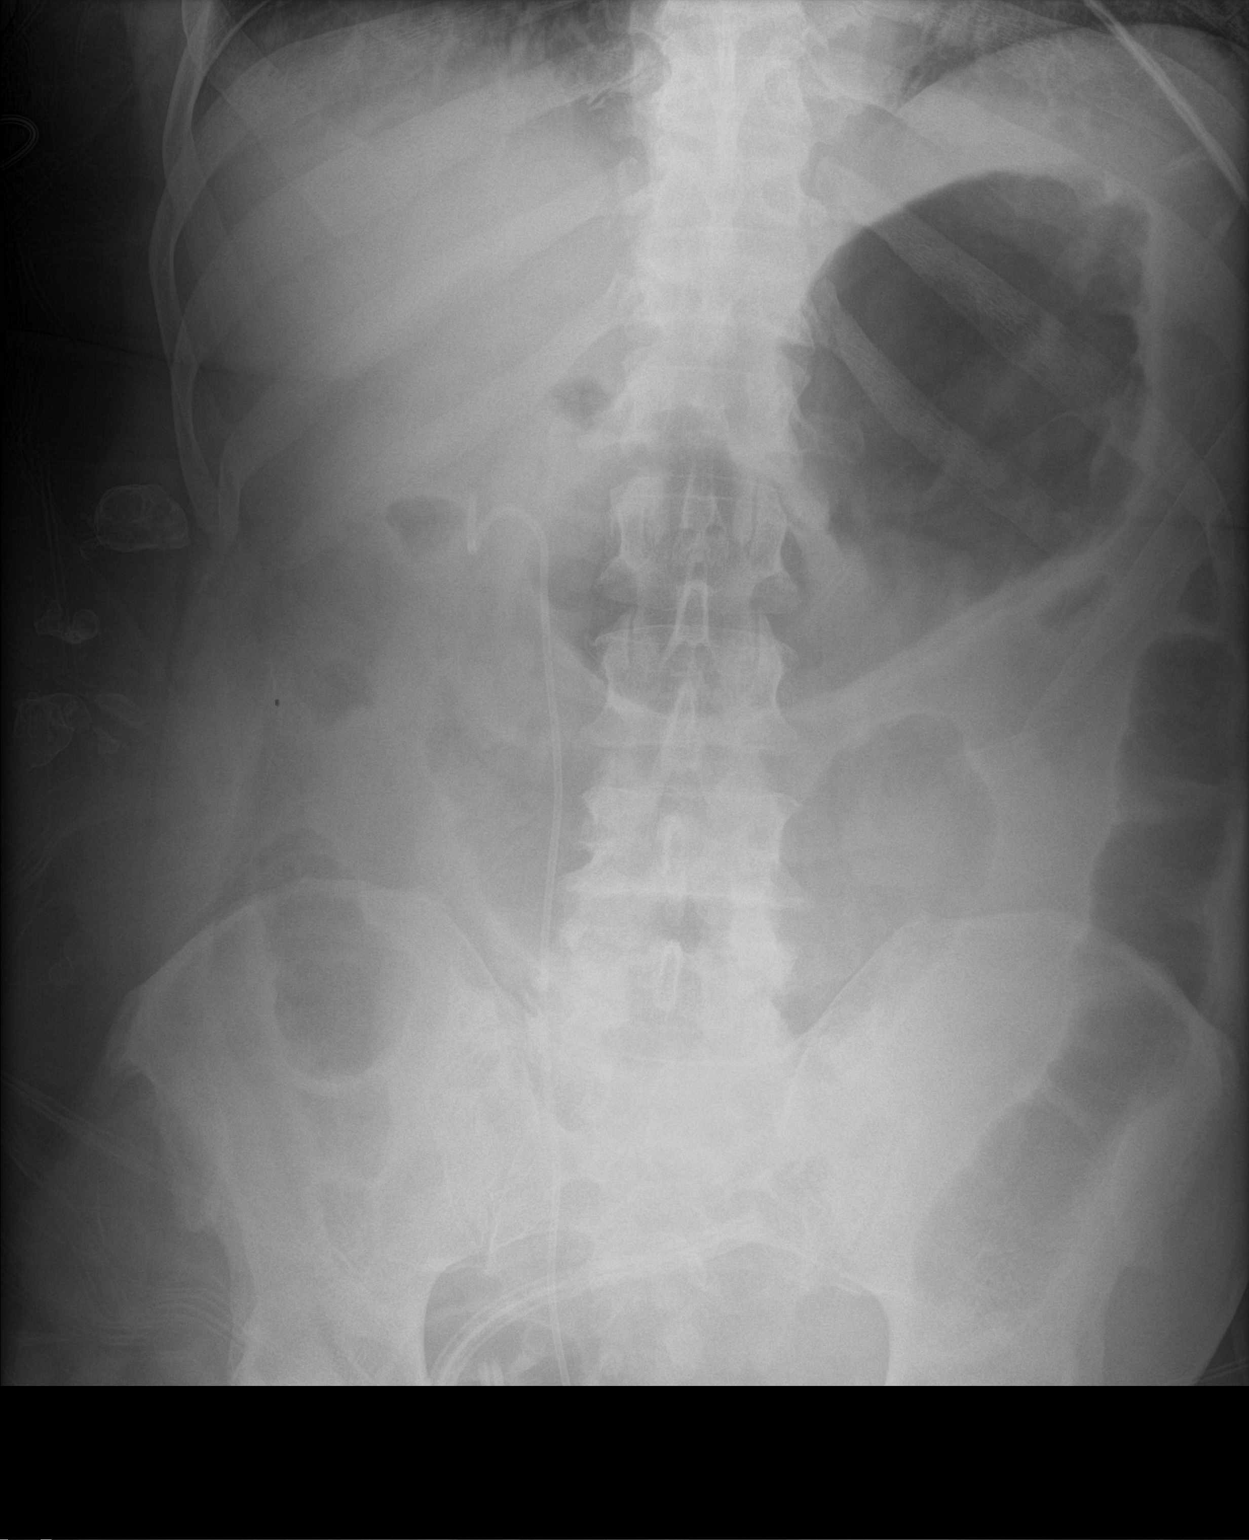

[abdomen kub (2 of 2)]
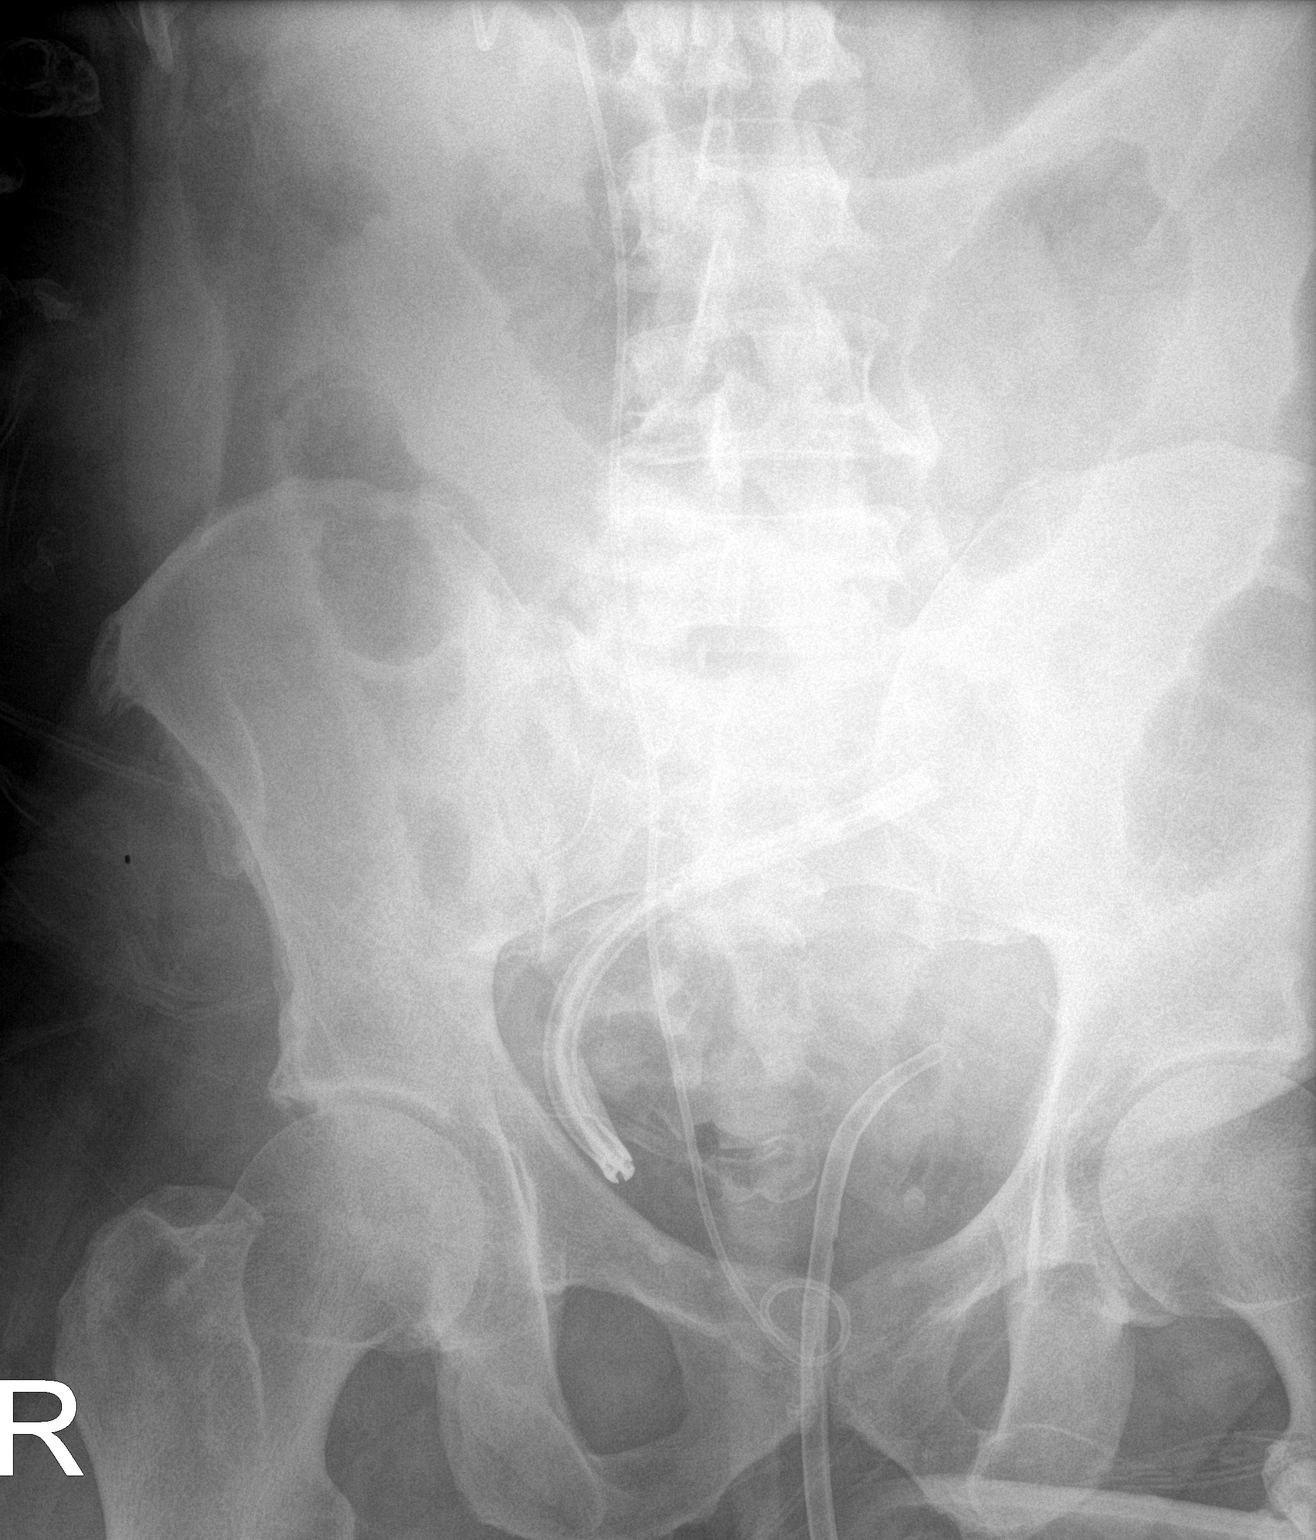

[2 of 2 positions shown; findings below may reference images not displayed]

FINDINGS: Proximal right ureter stent overlies the expected area of the right
renal pelvis and distal stent overlies expected area of the bladder.
Surgical drain overlying the pelvis. Calcifications of the right
flank are within large lipomatous lesion when compared with prior
CT. The bowel gas pattern is normal. No radio-opaque calculi or
other significant radiographic abnormality are seen.
IMPRESSION: Interval placement of right ureter stent, Proximal right ureter
stent overlies the expected area of the right renal pelvis and
distal stent overlies expected area of the bladder.

## 2022-03-17 SURGERY — XI ROBOTIC ASSISTED LAPAROSCOPIC RADICAL PROSTATECTOMY LEVEL 2
Anesthesia: General | Site: Abdomen

## 2022-03-17 MED ORDER — KETOROLAC TROMETHAMINE 15 MG/ML IJ SOLN
15.0000 mg | Freq: Four times a day (QID) | INTRAMUSCULAR | Status: DC
  Administered 2022-03-17 – 2022-03-18 (×4): 15 mg via INTRAVENOUS
  Filled 2022-03-17 (×4): qty 1

## 2022-03-17 MED ORDER — ROCURONIUM BROMIDE 10 MG/ML (PF) SYRINGE
PREFILLED_SYRINGE | INTRAVENOUS | Status: DC | PRN
  Administered 2022-03-17: 10 mg via INTRAVENOUS
  Administered 2022-03-17: 20 mg via INTRAVENOUS
  Administered 2022-03-17: 30 mg via INTRAVENOUS
  Administered 2022-03-17: 70 mg via INTRAVENOUS

## 2022-03-17 MED ORDER — MORPHINE SULFATE (PF) 2 MG/ML IV SOLN
2.0000 mg | INTRAVENOUS | Status: DC | PRN
  Administered 2022-03-17: 4 mg via INTRAVENOUS
  Administered 2022-03-17 – 2022-03-18 (×2): 2 mg via INTRAVENOUS
  Filled 2022-03-17: qty 2
  Filled 2022-03-17 (×2): qty 1

## 2022-03-17 MED ORDER — EPHEDRINE 5 MG/ML INJ
INTRAVENOUS | Status: AC
  Filled 2022-03-17: qty 5

## 2022-03-17 MED ORDER — DEXAMETHASONE SODIUM PHOSPHATE 10 MG/ML IJ SOLN
INTRAMUSCULAR | Status: AC
  Filled 2022-03-17: qty 1

## 2022-03-17 MED ORDER — HYOSCYAMINE SULFATE 0.125 MG SL SUBL
0.1250 mg | SUBLINGUAL_TABLET | Freq: Four times a day (QID) | SUBLINGUAL | Status: DC | PRN
  Filled 2022-03-17: qty 1

## 2022-03-17 MED ORDER — LACTATED RINGERS IV SOLN: INTRAVENOUS | Status: DC

## 2022-03-17 MED ORDER — DOCUSATE SODIUM 100 MG PO CAPS: 100.0000 mg | ORAL_CAPSULE | Freq: Two times a day (BID) | ORAL | Status: DC

## 2022-03-17 MED ORDER — FLUORESCEIN SODIUM 10 % IV SOLN
INTRAVENOUS | Status: AC
  Filled 2022-03-17: qty 5

## 2022-03-17 MED ORDER — EPHEDRINE SULFATE-NACL 50-0.9 MG/10ML-% IV SOSY
PREFILLED_SYRINGE | INTRAVENOUS | Status: DC | PRN
  Administered 2022-03-17: 5 mg via INTRAVENOUS

## 2022-03-17 MED ORDER — FENTANYL CITRATE (PF) 100 MCG/2ML IJ SOLN
INTRAMUSCULAR | Status: AC
  Filled 2022-03-17: qty 2

## 2022-03-17 MED ORDER — LACTATED RINGERS IV SOLN
INTRAVENOUS | Status: DC | PRN
  Administered 2022-03-17: 1000 mL

## 2022-03-17 MED ORDER — ROSUVASTATIN CALCIUM 10 MG PO TABS
10.0000 mg | ORAL_TABLET | Freq: Every day | ORAL | Status: DC
  Administered 2022-03-18: 10 mg via ORAL
  Filled 2022-03-17: qty 1

## 2022-03-17 MED ORDER — BUPIVACAINE-EPINEPHRINE (PF) 0.25% -1:200000 IJ SOLN
INTRAMUSCULAR | Status: AC
  Filled 2022-03-17: qty 30

## 2022-03-17 MED ORDER — DOCUSATE SODIUM 100 MG PO CAPS
100.0000 mg | ORAL_CAPSULE | Freq: Two times a day (BID) | ORAL | Status: DC
  Administered 2022-03-17 – 2022-03-18 (×2): 100 mg via ORAL
  Filled 2022-03-17 (×2): qty 1

## 2022-03-17 MED ORDER — FENTANYL CITRATE (PF) 250 MCG/5ML IJ SOLN
INTRAMUSCULAR | Status: DC | PRN
  Administered 2022-03-17: 50 ug via INTRAVENOUS
  Administered 2022-03-17: 100 ug via INTRAVENOUS
  Administered 2022-03-17: 50 ug via INTRAVENOUS

## 2022-03-17 MED ORDER — LIDOCAINE 2% (20 MG/ML) 5 ML SYRINGE
INTRAMUSCULAR | Status: DC | PRN
  Administered 2022-03-17: 100 mg via INTRAVENOUS

## 2022-03-17 MED ORDER — BACITRACIN-NEOMYCIN-POLYMYXIN 400-5-5000 EX OINT: 1.0000 "application " | TOPICAL_OINTMENT | Freq: Three times a day (TID) | CUTANEOUS | Status: DC | PRN

## 2022-03-17 MED ORDER — MIDAZOLAM HCL 2 MG/2ML IJ SOLN
INTRAMUSCULAR | Status: AC
  Filled 2022-03-17: qty 2

## 2022-03-17 MED ORDER — HEPARIN SODIUM (PORCINE) 1000 UNIT/ML IJ SOLN
INTRAMUSCULAR | Status: AC
  Filled 2022-03-17: qty 1

## 2022-03-17 MED ORDER — LIDOCAINE HCL 2 % IJ SOLN
INTRAMUSCULAR | Status: AC
  Filled 2022-03-17: qty 20

## 2022-03-17 MED ORDER — HYDROMORPHONE HCL 1 MG/ML IJ SOLN: 0.2500 mg | INTRAMUSCULAR | Status: DC | PRN

## 2022-03-17 MED ORDER — ROCURONIUM BROMIDE 10 MG/ML (PF) SYRINGE
PREFILLED_SYRINGE | INTRAVENOUS | Status: AC
  Filled 2022-03-17: qty 10

## 2022-03-17 MED ORDER — PHENYLEPHRINE HCL-NACL 20-0.9 MG/250ML-% IV SOLN
INTRAVENOUS | Status: DC | PRN
  Administered 2022-03-17: 20 ug/min via INTRAVENOUS

## 2022-03-17 MED ORDER — CEFAZOLIN SODIUM-DEXTROSE 1-4 GM/50ML-% IV SOLN
1.0000 g | Freq: Three times a day (TID) | INTRAVENOUS | Status: AC
  Administered 2022-03-17 (×2): 1 g via INTRAVENOUS
  Filled 2022-03-17 (×2): qty 50

## 2022-03-17 MED ORDER — ZOLPIDEM TARTRATE 5 MG PO TABS: 5.0000 mg | ORAL_TABLET | Freq: Every evening | ORAL | Status: DC | PRN

## 2022-03-17 MED ORDER — DEXAMETHASONE SODIUM PHOSPHATE 10 MG/ML IJ SOLN
INTRAMUSCULAR | Status: DC | PRN
  Administered 2022-03-17: 8 mg via INTRAVENOUS

## 2022-03-17 MED ORDER — LIDOCAINE 20MG/ML (2%) 15 ML SYRINGE OPTIME
INTRAMUSCULAR | Status: DC | PRN
  Administered 2022-03-17: 1.5 mg/kg/h via INTRAVENOUS

## 2022-03-17 MED ORDER — SULFAMETHOXAZOLE-TRIMETHOPRIM 800-160 MG PO TABS: 1.0000 | ORAL_TABLET | Freq: Two times a day (BID) | ORAL | 0 refills | Status: DC

## 2022-03-17 MED ORDER — ONDANSETRON HCL 4 MG/2ML IJ SOLN
INTRAMUSCULAR | Status: AC
  Filled 2022-03-17: qty 2

## 2022-03-17 MED ORDER — CHLORHEXIDINE GLUCONATE 0.12 % MT SOLN
15.0000 mL | Freq: Once | OROMUCOSAL | Status: AC
  Administered 2022-03-17: 15 mL via OROMUCOSAL

## 2022-03-17 MED ORDER — CEFAZOLIN SODIUM-DEXTROSE 2-4 GM/100ML-% IV SOLN
2.0000 g | INTRAVENOUS | Status: AC
  Administered 2022-03-17: 2 g via INTRAVENOUS
  Filled 2022-03-17: qty 100

## 2022-03-17 MED ORDER — ACETAMINOPHEN 500 MG PO TABS
1000.0000 mg | ORAL_TABLET | Freq: Once | ORAL | Status: AC
  Administered 2022-03-17: 1000 mg via ORAL
  Filled 2022-03-17: qty 2

## 2022-03-17 MED ORDER — SODIUM CHLORIDE 0.9 % IR SOLN
Status: DC | PRN
  Administered 2022-03-17: 1000 mL via INTRAVESICAL

## 2022-03-17 MED ORDER — MIDAZOLAM HCL 2 MG/2ML IJ SOLN
INTRAMUSCULAR | Status: DC | PRN
  Administered 2022-03-17: 2 mg via INTRAVENOUS

## 2022-03-17 MED ORDER — PROPOFOL 10 MG/ML IV BOLUS
INTRAVENOUS | Status: AC
  Filled 2022-03-17: qty 20

## 2022-03-17 MED ORDER — HYDROCODONE-ACETAMINOPHEN 5-325 MG PO TABS: 1.0000 | ORAL_TABLET | Freq: Four times a day (QID) | ORAL | 0 refills | Status: DC | PRN

## 2022-03-17 MED ORDER — FLEET ENEMA 7-19 GM/118ML RE ENEM: 1.0000 | ENEMA | Freq: Once | RECTAL | Status: DC

## 2022-03-17 MED ORDER — ORAL CARE MOUTH RINSE: 15.0000 mL | Freq: Once | OROMUCOSAL | Status: AC

## 2022-03-17 MED ORDER — FLUORESCEIN SODIUM 10 % IV SOLN
INTRAVENOUS | Status: DC | PRN
  Administered 2022-03-17: 25 mg via INTRAVENOUS

## 2022-03-17 MED ORDER — DROPERIDOL 2.5 MG/ML IJ SOLN: 0.6250 mg | Freq: Once | INTRAMUSCULAR | Status: DC | PRN

## 2022-03-17 MED ORDER — ACETAMINOPHEN 325 MG PO TABS: 650.0000 mg | ORAL_TABLET | ORAL | Status: DC | PRN

## 2022-03-17 MED ORDER — PROPOFOL 10 MG/ML IV BOLUS
INTRAVENOUS | Status: DC | PRN
  Administered 2022-03-17 (×2): 50 mg via INTRAVENOUS
  Administered 2022-03-17: 200 mg via INTRAVENOUS

## 2022-03-17 MED ORDER — SUGAMMADEX SODIUM 200 MG/2ML IV SOLN
INTRAVENOUS | Status: DC | PRN
  Administered 2022-03-17: 200 mg via INTRAVENOUS
  Administered 2022-03-17: 100 mg via INTRAVENOUS

## 2022-03-17 MED ORDER — ONDANSETRON HCL 4 MG/2ML IJ SOLN
INTRAMUSCULAR | Status: DC | PRN
  Administered 2022-03-17: 4 mg via INTRAVENOUS

## 2022-03-17 MED ORDER — SODIUM CHLORIDE 0.9 % IV BOLUS
1000.0000 mL | Freq: Once | INTRAVENOUS | Status: AC
  Administered 2022-03-17: 1000 mL via INTRAVENOUS

## 2022-03-17 MED ORDER — DIPHENHYDRAMINE HCL 50 MG/ML IJ SOLN: 12.5000 mg | Freq: Four times a day (QID) | INTRAMUSCULAR | Status: DC | PRN

## 2022-03-17 MED ORDER — ONDANSETRON HCL 4 MG/2ML IJ SOLN: 4.0000 mg | INTRAMUSCULAR | Status: DC | PRN

## 2022-03-17 MED ORDER — POLYETHYLENE GLYCOL 3350 17 G PO PACK: 17.0000 g | PACK | Freq: Every day | ORAL | Status: DC

## 2022-03-17 MED ORDER — STERILE WATER FOR IRRIGATION IR SOLN
Status: DC | PRN
  Administered 2022-03-17: 1000 mL

## 2022-03-17 MED ORDER — BUPIVACAINE-EPINEPHRINE 0.25% -1:200000 IJ SOLN
INTRAMUSCULAR | Status: DC | PRN
Start: 2022-03-17 — End: 2022-03-17
  Administered 2022-03-17: 8 mL
  Administered 2022-03-17: 22 mL

## 2022-03-17 MED ORDER — KCL IN DEXTROSE-NACL 20-5-0.45 MEQ/L-%-% IV SOLN
INTRAVENOUS | Status: DC
  Filled 2022-03-17 (×3): qty 1000

## 2022-03-17 MED ORDER — DIPHENHYDRAMINE HCL 12.5 MG/5ML PO ELIX: 12.5000 mg | ORAL_SOLUTION | Freq: Four times a day (QID) | ORAL | Status: DC | PRN

## 2022-03-17 MED ORDER — LACTATED RINGERS IV SOLN: INTRAVENOUS | Status: DC | PRN

## 2022-03-17 SURGICAL SUPPLY — 67 items
APPLICATOR COTTON TIP 6 STRL (MISCELLANEOUS) ×2 IMPLANT
APPLICATOR COTTON TIP 6IN STRL (MISCELLANEOUS) ×3
BAG COUNTER SPONGE SURGICOUNT (BAG) IMPLANT
BAG RETRIEVAL 10 (BASKET) ×1
CATH FOLEY 2WAY SLVR 18FR 30CC (CATHETERS) ×3 IMPLANT
CATH ROBINSON RED A/P 16FR (CATHETERS) ×3 IMPLANT
CATH ROBINSON RED A/P 8FR (CATHETERS) ×3 IMPLANT
CATH TIEMANN FOLEY 18FR 5CC (CATHETERS) ×3 IMPLANT
CHLORAPREP W/TINT 26 (MISCELLANEOUS) ×3 IMPLANT
CLIP LIGATING HEM O LOK PURPLE (MISCELLANEOUS) ×8 IMPLANT
COVER SURGICAL LIGHT HANDLE (MISCELLANEOUS) ×3 IMPLANT
COVER TIP SHEARS 8 DVNC (MISCELLANEOUS) ×2 IMPLANT
COVER TIP SHEARS 8MM DA VINCI (MISCELLANEOUS) ×1
CUTTER ECHEON FLEX ENDO 45 340 (ENDOMECHANICALS) ×3 IMPLANT
DERMABOND ADVANCED (GAUZE/BANDAGES/DRESSINGS) ×1
DERMABOND ADVANCED .7 DNX12 (GAUZE/BANDAGES/DRESSINGS) ×2 IMPLANT
DRAIN CHANNEL RND F F (WOUND CARE) IMPLANT
DRAPE ARM DVNC X/XI (DISPOSABLE) ×8 IMPLANT
DRAPE COLUMN DVNC XI (DISPOSABLE) ×2 IMPLANT
DRAPE DA VINCI XI ARM (DISPOSABLE) ×4
DRAPE DA VINCI XI COLUMN (DISPOSABLE) ×1
DRAPE SURG IRRIG POUCH 19X23 (DRAPES) ×3 IMPLANT
DRSG TEGADERM 4X4.75 (GAUZE/BANDAGES/DRESSINGS) ×3 IMPLANT
ELECT PENCIL ROCKER SW 15FT (MISCELLANEOUS) ×3 IMPLANT
ELECT REM PT RETURN 15FT ADLT (MISCELLANEOUS) ×3 IMPLANT
GAUZE 4X4 16PLY ~~LOC~~+RFID DBL (SPONGE) ×2 IMPLANT
GAUZE SPONGE 4X4 12PLY STRL (GAUZE/BANDAGES/DRESSINGS) ×3 IMPLANT
GLOVE BIO SURGEON STRL SZ 6.5 (GLOVE) ×3 IMPLANT
GLOVE SURG LX 7.5 STRW (GLOVE) ×3
GLOVE SURG LX STRL 7.5 STRW (GLOVE) ×4 IMPLANT
GUIDEWIRE STR DUAL SENSOR (WIRE) ×1 IMPLANT
HOLDER FOLEY CATH W/STRAP (MISCELLANEOUS) ×3 IMPLANT
IRRIG SUCT STRYKERFLOW 2 WTIP (MISCELLANEOUS) ×3
IRRIGATION SUCT STRKRFLW 2 WTP (MISCELLANEOUS) ×2 IMPLANT
IV LACTATED RINGERS 1000ML (IV SOLUTION) ×3 IMPLANT
KIT TURNOVER KIT A (KITS) IMPLANT
NDL SAFETY ECLIPSE 18X1.5 (NEEDLE) ×2 IMPLANT
NEEDLE HYPO 18GX1.5 SHARP (NEEDLE) ×1
PACK ROBOT UROLOGY CUSTOM (CUSTOM PROCEDURE TRAY) ×3 IMPLANT
RELOAD STAPLE 45 4.1 GRN THCK (STAPLE) ×2 IMPLANT
SEAL CANN UNIV 5-8 DVNC XI (MISCELLANEOUS) ×8 IMPLANT
SEAL XI 5MM-8MM UNIVERSAL (MISCELLANEOUS) ×4
SET TUBE SMOKE EVAC HIGH FLOW (TUBING) ×3 IMPLANT
SOLUTION ELECTROLUBE (MISCELLANEOUS) ×3 IMPLANT
SPIKE FLUID TRANSFER (MISCELLANEOUS) ×3 IMPLANT
STAPLE RELOAD 45 GRN (STAPLE) ×2 IMPLANT
STAPLE RELOAD 45MM GREEN (STAPLE) ×1
STENT URET 6FRX26 CONTOUR (STENTS) ×1 IMPLANT
SUT ETHILON 3 0 PS 1 (SUTURE) ×3 IMPLANT
SUT MNCRL 3 0 RB1 (SUTURE) ×2 IMPLANT
SUT MNCRL 3 0 VIOLET RB1 (SUTURE) ×2 IMPLANT
SUT MNCRL AB 4-0 PS2 18 (SUTURE) ×6 IMPLANT
SUT MONOCRYL 3 0 RB1 (SUTURE) ×3
SUT PDS PLUS 0 (SUTURE) ×2
SUT PDS PLUS AB 0 CT-2 (SUTURE) ×4 IMPLANT
SUT VIC AB 0 CT1 27 (SUTURE) ×2
SUT VIC AB 0 CT1 27XBRD ANTBC (SUTURE) ×4 IMPLANT
SUT VIC AB 2-0 SH 27 (SUTURE) ×1
SUT VIC AB 2-0 SH 27X BRD (SUTURE) ×2 IMPLANT
SUT VIC AB 3-0 SH 27 (SUTURE) ×2
SUT VIC AB 3-0 SH 27XBRD (SUTURE) IMPLANT
SYR 27GX1/2 1ML LL SAFETY (SYRINGE) ×3 IMPLANT
SYS BAG RETRIEVAL 10MM (BASKET) ×2
SYSTEM BAG RETRIEVAL 10MM (BASKET) IMPLANT
TOWEL OR NON WOVEN STRL DISP B (DISPOSABLE) ×3 IMPLANT
TROCAR XCEL NON-BLD 5MMX100MML (ENDOMECHANICALS) IMPLANT
WATER STERILE IRR 1000ML POUR (IV SOLUTION) ×3 IMPLANT

## 2022-03-17 NOTE — Interval H&P Note (Signed)
History and Physical Interval Note: ? ?03/17/2022 ?6:46 AM ? ?Jamie Gonzalez  has presented today for surgery, with the diagnosis of PROSTATE CANCER.  The various methods of treatment have been discussed with the patient and family. After consideration of risks, benefits and other options for treatment, the patient has consented to  Procedure(s): ?XI ROBOTIC ASSISTED LAPAROSCOPIC RADICAL PROSTATECTOMY LEVEL 2 (N/A) ?LYMPHADENECTOMY, PELVIC (Bilateral) as a surgical intervention.  The patient's history has been reviewed, patient examined, no change in status, stable for surgery.  I have reviewed the patient's chart and labs.  Questions were answered to the patient's satisfaction.   ? ? ?[name] ? ? ?

## 2022-03-17 NOTE — Transfer of Care (Signed)
Immediate Anesthesia Transfer of Care Note ? ?Patient: Jamie Gonzalez ? ?Procedure(s) Performed: XI ROBOTIC ASSISTED LAPAROSCOPIC RADICAL PROSTATECTOMY LEVEL 2 (Abdomen) ?LYMPHADENECTOMY, PELVIC (Bilateral: Abdomen) ? ?Patient Location: PACU ? ?Anesthesia Type:General ? ?Level of Consciousness: awake and patient cooperative ? ?Airway & Oxygen Therapy: Patient Spontanous Breathing and Patient connected to face mask oxygen ? ?Post-op Assessment: Report given to RN and Post -op Vital signs reviewed and stable ? ?Post vital signs: Reviewed and stable ? ?Last Vitals:  ?Vitals Value Taken Time  ?BP 168/118 03/17/22 1035  ?Temp    ?Pulse 94 03/17/22 1037  ?Resp 27 03/17/22 1037  ?SpO2 100 % 03/17/22 1037  ?Vitals shown include unvalidated device data. ? ?Last Pain:  ?Vitals:  ? 03/17/22 0546  ?TempSrc: Oral  ?PainSc:   ?   ? ?  ? ?Complications: No notable events documented. ?

## 2022-03-17 NOTE — Discharge Instructions (Signed)

## 2022-03-17 NOTE — Anesthesia Procedure Notes (Signed)
Procedure Name: Intubation ?Date/Time: 03/17/2022 7:26 AM ?Performed by: Brennan Bailey, MD ?Pre-anesthesia Checklist: Patient identified, Emergency Drugs available, Suction available and Patient being monitored ?Patient Re-evaluated:Patient Re-evaluated prior to induction ?Oxygen Delivery Method: Circle System Utilized ?Preoxygenation: Pre-oxygenation with 100% oxygen ?Induction Type: IV induction ?Ventilation: Mask ventilation without difficulty and Oral airway inserted - appropriate to patient size ?Laryngoscope Size: Glidescope, 3 and 4 ?Tube type: Oral ?Tube size: 7.5 mm ?Number of attempts: 4 ?Airway Equipment and Method: Oral airway, Video-laryngoscopy and Rigid stylet ?Placement Confirmation: ETT inserted through vocal cords under direct vision, positive ETCO2 and breath sounds checked- equal and bilateral ?Secured at: 23 cm ?Tube secured with: Tape ?Dental Injury: Teeth and Oropharynx as per pre-operative assessment  ?Difficulty Due To: Difficulty was unanticipated and Difficult Airway- due to anterior larynx ?Future Recommendations: Recommend- induction with short-acting agent, and alternative techniques readily available ?Comments: First attempt with Mac3 by CRNA unable to view glottis. Second attempt by myself with Miller2, grade 3 view. Glidescope #3 blade attempt by CRNA with 2B view, unable to approach angle with rigid stylet. Next attempt by myself with same view, ETT placed atraumatically. Mask ventilated between attempts with SpO2 >95% throughout. Recommend videolaryngoscopy for future intubation attempts. Daiva Huge, MD ? ? ? ? ?

## 2022-03-17 NOTE — Progress Notes (Signed)
Patient ID: Jamie Gonzalez, male   DOB: May 20, 1956, 66 y.o.   MRN: [id number] ? ?Post-op note ? ?Subjective: ?The patient is doing well.  No complaints. ? ?Objective: ?Vital signs in last 24 hours: ?Temp:  [97.4 ?F (36.3 ?C)-99.3 ?F (37.4 ?C)] 99.3 ?F (37.4 ?C) (05/11 1657) ?Pulse Rate:  [78-95] 88 (05/11 1657) ?Resp:  [18-27] 20 (05/11 1657) ?BP: (145-162)/(87-120) 145/88 (05/11 1657) ?SpO2:  [94 %-100 %] 97 % (05/11 1657) ?Weight:  [107 kg] 107 kg (05/11 0543) ? ?Intake/Output from previous day: ?No intake/output data recorded. ?Intake/Output this shift: ?Total I/O ?In: 0947 [P.O.:120; I.V.:2279; IV Piggyback:1100] ?Out: 510 [Urine:400; Drains:10; Blood:100] ? ?Physical Exam:  ?General: Alert and oriented. ?Abdomen: Soft, Nondistended. ?Incisions: Clean and dry. ?GU: Urine clear ? ?Lab Results: ?Recent Labs  ?  03/17/22 ?1105  ?HGB 13.2  ?HCT 39.7  ? ?KUB: Right ureteral stent in proper position ? ? ?Assessment/Plan: ?POD#0  ? ?1) Continue to monitor, ambulate, IS ? ? ?Pryor Curia MD ? ? LOS: 0 days  ? ?Jamie Gonzalez ?03/17/2022, 5:33 PM ? ? ?   ?

## 2022-03-17 NOTE — Op Note (Signed)
Preoperative diagnosis: Clinically localized adenocarcinoma of the prostate (clinical stage T1c N0 M0) ? ?Postoperative diagnosis: Clinically localized adenocarcinoma of the prostate (clinical stage T1c N0 M0) ? ?Procedure: ? ?Robotic assisted laparoscopic radical prostatectomy (left nerve sparing) ?Bilateral robotic assisted laparoscopic pelvic lymphadenectomy ?Right ureteral stent placement (6 x 26 - no string) ? ?Surgeon: Roxy Horseman, Brooke Bonito. M.D. ? ?Assistant(s): Debbrah Alar, PA-C ? ?An assistant was required for this surgical procedure.  The duties of the assistant included but were not limited to suctioning, passing suture, camera manipulation, retraction. This procedure would not be able to be performed without an Environmental consultant.  ? ?Anesthesia: General ? ?Complications: None ? ?EBL: 100 mL ? ?IVF:  1800 mL crystalloid ? ?Specimens: ?Prostate and seminal vesicles ?Right pelvic lymph nodes ?Left pelvic lymph nodes ? ?Disposition of specimens: Pathology ? ?Drains: ?20 Fr coude catheter ?# 19 Blake pelvic drain ? ?Indication: Jamie Gonzalez is a 66 y.o. patient with clinically localized prostate cancer.  After a thorough review of the management options for treatment of prostate cancer, Jamie Gonzalez elected to proceed with surgical therapy and the above procedure(s).  We have discussed the potential benefits and risks of the procedure, side effects of the proposed treatment, the likelihood of the patient achieving the goals of the procedure, and any potential problems that might occur during the procedure or recuperation. Informed consent has been obtained. ? ?Description of procedure: ? ?The patient was taken to the operating room and a general anesthetic was administered. Jamie Gonzalez was given preoperative antibiotics, placed in the dorsal lithotomy position, and prepped and draped in the usual sterile fashion. Next a preoperative timeout was performed. A urethral catheter was placed into the bladder and a site was selected  near the umbilicus for placement of the camera port. This was placed using a standard open Hassan technique which allowed entry into the peritoneal cavity under direct vision and without difficulty. An 8 mm port was placed and a pneumoperitoneum established. The camera was then used to inspect the abdomen and there was no evidence of any intra-abdominal injuries or other abnormalities. The remaining abdominal ports were then placed. 8 mm robotic ports were placed in the right lower quadrant, left lower quadrant, and far left lateral abdominal wall. A 5 mm port was placed in the right upper quadrant and a 12 mm port was placed in the right lateral abdominal wall for laparoscopic assistance. All ports were placed under direct vision without difficulty. The surgical cart was then docked.  ? ?Utilizing the cautery scissors, the bladder was reflected posteriorly allowing entry into the space of Retzius and identification of the endopelvic fascia and prostate. The periprostatic fat was then removed from the prostate allowing full exposure of the endopelvic fascia. The endopelvic fascia was then incised from the apex back to the base of the prostate bilaterally and the underlying levator muscle fibers were swept laterally off the prostate thereby isolating the dorsal venous complex. The dorsal vein was then stapled and divided with a 45 mm Flex Echelon stapler. Attention then turned to the bladder neck which was divided anteriorly thereby allowing entry into the bladder and exposure of the urethral catheter. The catheter balloon was deflated and the catheter was brought into the operative field and used to retract the prostate anteriorly. The posterior bladder neck was then examined.  There was a very large median lobe that was dissected free and lifted anteriorly out of the prostate.  Fluorescein was administered and helped identify the ureteral orifices.  The posterior bladder neck was divided allowing further dissection  between the bladder and prostate posteriorly.  During this dissection there was noted to be entry into the mucosa on the right side.  This was noted to be near the right ureteral orifice but there was adequate efflux from the right ureteral orifice.  The dissection then continued until the vasa deferentia and seminal vesicles were identified. The vasa deferentia were isolated, divided, and lifted anteriorly. The seminal vesicles were dissected down to their tips with care to control the seminal vascular arterial blood supply. These structures were then lifted anteriorly and the space between Denonvillier?s fascia and the anterior rectum was developed with a combination of sharp and blunt dissection. This isolated the vascular pedicles of the prostate. ? ?The lateral prostatic fascia on the left side of the prostate was then sharply incised allowing release of the neurovascular bundle. The vascular pedicle of the prostate on the left side was then ligated with Weck clips between the prostate and neurovascular bundle and divided with sharp cold scissor dissection resulting in neurovascular bundle preservation. On the right side, a wide non nerve sparing dissection was performed with Weck clips used to ligate the vascular pedicle of the prostate. The neurovascular bundle on the left side was then separated off the apex of the prostate and urethra. ? ? ?The urethra was then sharply transected allowing the prostate specimen to be disarticulated. The pelvis was copiously irrigated and hemostasis was ensured. There was no evidence for rectal injury. ? ?Attention then turned to the right pelvic sidewall. The fibrofatty tissue between the external iliac vein, confluence of the iliac vessels, hypogastric artery, and Cooper's ligament was dissected free from the pelvic sidewall with care to preserve the obturator nerve. Weck clips were used for lymphostasis and hemostasis. An identical procedure was performed on the  contralateral side and the lymphatic packets were removed for permanent pathologic analysis. ? ?Attention then turned to the bladder neck.  It was felt that a right ureteral stent would be beneficial considering the mucosal opening near the orifice.  A 0.38 sensor guidewire was inserted up the right ureter and a 6 x 26 double J ureteral stent was advanced up the right ureter and the wire was removed.  The bladder neck was then repaired in 2 layers with a running 3-0 vicryl mucosal layer and 3-0 vicryl muscular layer.   The stent was checked and was easily moved.  Attention then turned to the urethral anastomosis. A 2-0 Vicryl slip knot was placed between Denonvillier?s fascia, the posterior bladder neck, and the posterior urethra to reapproximate these structures. A double-armed 3-0 Monocryl suture was then used to perform a 360? running tension-free anastomosis between the bladder neck and urethra. An additional 3-0 monocryl was used to complete the anastomosis. A new urethral catheter was then placed into the bladder and irrigated. There were no blood clots within the bladder and the anastomosis appeared to be watertight. A #19 Blake drain was then brought through the left lateral 8 mm port site and positioned appropriately within the pelvis. It was secured to the skin with a nylon suture. The surgical cart was then undocked. The right lateral 12 mm port site was closed at the fascial level with a 0 Vicryl suture placed laparoscopically. All remaining ports were then removed under direct vision. The prostate specimen was removed intact within the Endopouch retrieval bag via the periumbilical camera port site. This fascial opening was closed with two running 0 PDS sutures.  0.25% Marcaine was then injected into all port sites and all incisions were reapproximated at the skin level with 4-0 Monocryl subcuticular sutures and Dermabond. The patient appeared to tolerate the procedure well and without complications. The  patient was able to be extubated and transferred to the recovery unit in satisfactory condition. ? ? ?Pryor Curia MD  ?

## 2022-03-17 NOTE — Anesthesia Postprocedure Evaluation (Signed)
Anesthesia Post Note ? ?Patient: Jamie Gonzalez ? ?Procedure(s) Performed: XI ROBOTIC ASSISTED LAPAROSCOPIC RADICAL PROSTATECTOMY LEVEL 2, RIGHT URETERAL STENT PLACEMENT (Abdomen) ?LYMPHADENECTOMY, PELVIC (Bilateral: Abdomen) ? ?  ? ?Patient location during evaluation: PACU ?Anesthesia Type: General ?Level of consciousness: awake and alert ?Pain management: pain level controlled ?Vital Signs Assessment: post-procedure vital signs reviewed and stable ?Respiratory status: spontaneous breathing, nonlabored ventilation and respiratory function stable ?Cardiovascular status: blood pressure returned to baseline ?Postop Assessment: no apparent nausea or vomiting ?Anesthetic complications: no ? ? ?No notable events documented. ? ?Last Vitals:  ?Vitals:  ? 03/17/22 1130 03/17/22 1145  ?BP: (!) 154/102 (!) 147/95  ?Pulse: 88 84  ?Resp: (!) 22 20  ?Temp:  36.5 ?C  ?SpO2: 98% 97%  ?  ?Last Pain:  ?Vitals:  ? 03/17/22 1145  ?TempSrc:   ?PainSc: 3   ? ? ?  ?  ?  ?  ?  ?  ? ?Marthenia Rolling ? ? ? ? ?

## 2022-03-18 ENCOUNTER — Encounter: Payer: Self-pay | Admitting: Genetic Counselor

## 2022-03-18 ENCOUNTER — Encounter (HOSPITAL_COMMUNITY): Payer: Self-pay | Admitting: Urology

## 2022-03-18 DIAGNOSIS — Z1379 Encounter for other screening for genetic and chromosomal anomalies: Secondary | ICD-10-CM | POA: Insufficient documentation

## 2022-03-18 DIAGNOSIS — F17210 Nicotine dependence, cigarettes, uncomplicated: Secondary | ICD-10-CM | POA: Diagnosis not present

## 2022-03-18 DIAGNOSIS — C61 Malignant neoplasm of prostate: Secondary | ICD-10-CM | POA: Diagnosis not present

## 2022-03-18 DIAGNOSIS — Z79899 Other long term (current) drug therapy: Secondary | ICD-10-CM | POA: Diagnosis not present

## 2022-03-18 LAB — HEMOGLOBIN AND HEMATOCRIT, BLOOD
HCT: 39.9 % (ref 39.0–52.0)
Hemoglobin: 13.1 g/dL (ref 13.0–17.0)

## 2022-03-18 MED ORDER — BISACODYL 10 MG RE SUPP
10.0000 mg | Freq: Once | RECTAL | Status: AC
  Administered 2022-03-18: 10 mg via RECTAL
  Filled 2022-03-18: qty 1

## 2022-03-18 MED ORDER — TRAMADOL HCL 50 MG PO TABS: 50.0000 mg | ORAL_TABLET | Freq: Four times a day (QID) | ORAL | Status: DC | PRN

## 2022-03-18 NOTE — TOC Initial Note (Signed)
Transition of Care (TOC) - Initial/Assessment Note  ? ? ?Patient Details  ?Name: KENNET MCCORT ?MRN: [id number] ?Date of Birth: May 22, 1956 ? ?Transition [facility] Care [facility]) CM/SW Contact:    ?Leeroy Cha, RN ?Phone Number: ?03/18/2022, 10:21 AM ? ?Clinical Narrative:                 ? ?Transition of Care (TOC) Screening Note ? ? ?Patient Details  ?Name: WYLDER MACOMBER ?Date of Birth: Jan 26, 1956 ? ? ?Transition of Care [facility]) CM/SW Contact:    ?Leeroy Cha, RN ?Phone Number: ?03/18/2022, 10:21 AM ? ? ? ?Transition of Care Department [facility]) has reviewed patient and no TOC needs have been identified at this time. We will continue to monitor patient advancement through interdisciplinary progression rounds. If new patient transition needs arise, please place a TOC consult. ? ? ? ?Expected Discharge Plan: Home/Self Care ?Barriers to Discharge: No Barriers Identified ? ? ?Patient Goals and CMS Choice ?Patient states their goals for this hospitalization and ongoing recovery are:: to go home ?CMS Medicare.gov Compare Post Acute Care list provided to:: Patient ?  ? ?Expected Discharge Plan and Services ?Expected Discharge Plan: Home/Self Care ?  ?Discharge Planning Services: CM Consult ?  ?Living arrangements for the past 2 months: [place] ?                ?  ?  ?  ?  ?  ?  ?  ?  ?  ?  ? ?Prior Living Arrangements/Services ?Living arrangements for the past 2 months: [place] ?Lives with:: Spouse ?Patient language and need for interpreter reviewed:: Yes ?Do you feel safe going back to the place where you live?: Yes      ?  ?  ?  ?Criminal Activity/Legal Involvement Pertinent to Current Situation/Hospitalization: No - Comment as needed ? ?Activities of Daily Living ?Home Assistive Devices/Equipment: None ?ADL Screening (condition at time of admission) ?Patient's cognitive ability adequate to safely complete daily activities?: Yes ?Is the patient deaf or have difficulty hearing?: No ?Does the patient  have difficulty seeing, even when wearing glasses/contacts?: No ?Does the patient have difficulty concentrating, remembering, or making decisions?: No ?Patient able to express need for assistance with ADLs?: Yes ?Does the patient have difficulty dressing or bathing?: No ?Independently performs ADLs?: Yes (appropriate for developmental age) ?Does the patient have difficulty walking or climbing stairs?: No ?Weakness of Legs: None ?Weakness of Arms/Hands: None ? ?Permission Sought/Granted ?  ?  ?   ?   ?   ?   ? ?Emotional Assessment ?Appearance:: Appears stated age ?  ?  ?Orientation: : Oriented to Self, Oriented to Place, Oriented to  Time, Oriented to Situation ?Alcohol / Substance Use: Not Applicable ?Psych Involvement: No (comment) ? ?Admission diagnosis:  Prostate cancer ([facility]) [C61] ?Patient Active Problem List  ? Diagnosis Date Noted  ? Genetic testing 03/18/2022  ? Prostate cancer [facility]); high risk; Gleason group 3,4; PSA 14.8 09/23/2021  ? Elevated PSA 07/27/2021  ? Microscopic hematuria 07/27/2021  ? BPH with obstruction/lower urinary tract symptoms 07/27/2021  ? Personal history of colonic polyps 08/06/2014  ? ?PCP:  [facility] ?Pharmacy:   ?[id number] [place] [address]?Phone: [phone]: [phone]? ? ? ? ?Social Determinants of Health (SDOH) Interventions ?  ? ?Readmission Risk Interventions ?   ? View : No data to display.  ?  ?  ?  ? ? ? ?

## 2022-03-18 NOTE — TOC Transition Note (Signed)
Transition of Care (TOC) - CM/SW Discharge Note ? ? ?Patient Details  ?Name: Jamie Gonzalez ?MRN: [id number] ?Date of Birth: 1956-06-25 ? ?Transition of Care [facility]) CM/SW Contact:  ?Leeroy Cha, RN ?Phone Number: ?03/18/2022, 12:37 PM ? ? ?Clinical Narrative:    ?Dcd to home ? ? ?Final next level of care: Home/Self Care ?Barriers to Discharge: No Barriers Identified ? ? ?Patient Goals and CMS Choice ?Patient states their goals for this hospitalization and ongoing recovery are:: to go home ?CMS Medicare.gov Compare Post Acute Care list provided to:: Patient ?  ? ?Discharge Placement ?  ?           ?  ?  ?  ?  ? ?Discharge Plan and Services ?  ?Discharge Planning Services: CM Consult ?           ?  ?  ?  ?  ?  ?  ?  ?  ?  ?  ? ?Social Determinants of Health (SDOH) Interventions ?  ? ? ?Readmission Risk Interventions ?   ? View : No data to display.  ?  ?  ?  ? ? ? ? ? ?

## 2022-03-18 NOTE — Progress Notes (Addendum)
Patient presented to [facility] on 4/25 and confirmed his decision to proceed with unilateral left nerve sparing robot-assisted laparoscopic radical prostatectomy and bilateral pelvic lymphadenectomy.  ? ?Patient had surgery done on 5/11 and has post op appointment on 5/17 @ [facility].  ?

## 2022-03-18 NOTE — Progress Notes (Signed)
Patient ID: Jamie Gonzalez, male   DOB: Mar 05, 1956, 66 y.o.   MRN: [id number] ? ?1 Day Post-Op ?Subjective: ?The patient is doing well.  No nausea or vomiting. Pain is adequately controlled. ? ?Objective: ?Vital signs in last 24 hours: ?Temp:  [97.4 ?F (36.3 ?C)-99.6 ?F (37.6 ?C)] 98.2 ?F (36.8 ?C) (05/12 9233) ?Pulse Rate:  [61-95] 61 (05/12 0837) ?Resp:  [16-27] 16 (05/12 0837) ?BP: (127-162)/(81-120) 127/81 (05/12 0076) ?SpO2:  [95 %-100 %] 98 % (05/12 0837) ? ?Intake/Output from previous day: ?05/11 0701 - 05/12 0700 ?In: 5657.8 [P.O.:120; I.V.:4387.8; IV Piggyback:1150] ?Out: 2990 [Urine:2700; Drains:190; Blood:100] ?Intake/Output this shift: ?Total I/O ?In: -  ?Out: 30 [Urine:450; Drains:40] ? ?Physical Exam:  ?General: Alert and oriented. ?CV: RRR ?Lungs: Clear bilaterally. ?GI: Soft, Nondistended. ?Incisions: Clean, dry, and intact ?Urine: Clear ?Extremities: Nontender, no erythema, no edema. ? ?Lab Results: ?Recent Labs  ?  03/17/22 ?1105 03/18/22 ?0445  ?HGB 13.2 13.1  ?HCT 39.7 39.9  ? ? ?  ?Assessment/Plan: ?POD# 1 s/p robotic prostatectomy. ? ?1) SL IVF ?2) Ambulate, Incentive spirometry ?3) Transition to oral pain medication ?4) Dulcolax suppository ?5) D/C pelvic drain ?6) Plan for likely discharge later today ? ? ?Pryor Curia MD ? ? LOS: 0 days  ? ?[name] ?03/18/2022, 8:44 AM ? ?  ?

## 2022-03-18 NOTE — Discharge Summary (Signed)
?  Date of admission: 03/17/2022 ? ?Date of discharge: 03/18/2022 ? ?Admission diagnosis: Prostate Cancer ? ?Discharge diagnosis: Prostate Cancer ? ?History and Physical: For full details, please see admission history and physical. Briefly, Jamie Gonzalez is a 66 y.o. gentleman with localized prostate cancer.  After discussing management/treatment options, he elected to proceed with surgical treatment. ? ?Hospital Course: Jamie Gonzalez was taken to the operating room on 03/17/2022 and underwent a robotic assisted laparoscopic radical prostatectomy. He tolerated this procedure well and without complications. Postoperatively, he was able to be transferred to a regular hospital room following recovery from anesthesia.  He was able to begin ambulating the night of surgery. He remained hemodynamically stable overnight.  He had excellent urine output with appropriately minimal output from his pelvic drain and his pelvic drain was removed on POD #1.  He was transitioned to oral pain medication, tolerated a clear liquid diet, and had met all discharge criteria and was able to be discharged home later on POD#1. ? ?Laboratory values:  ?Recent Labs  ?  03/17/22 ?1105 03/18/22 ?0445  ?HGB 13.2 13.1  ?HCT 39.7 39.9  ? ? ?Disposition: Home ? ?Discharge instruction: He was instructed to be ambulatory but to refrain from heavy lifting, strenuous activity, or driving. He was instructed on urethral catheter care. ? ?Discharge medications:   ?Allergies as of 03/18/2022   ? ?   Reactions  ? Other   ? PT reports breaking out in hives and itching after CT dye injection   ? ?  ? ?  ?Medication List  ?  ? ?STOP taking these medications   ? ?tamsulosin 0.4 MG Caps capsule ?Commonly known as: FLOMAX ?  ? ?  ? ?TAKE these medications   ? ?diphenhydrAMINE 25 MG tablet ?Commonly known as: BENADRYL ?Take 25 mg by mouth daily as needed for itching. ?  ?docusate sodium 100 MG capsule ?Commonly known as: COLACE ?Take 1 capsule (100 mg total) by  mouth 2 (two) times daily. ?  ?HYDROcodone-acetaminophen 5-325 MG tablet ?Commonly known as: Norco ?Take 1-2 tablets by mouth every 6 (six) hours as needed for moderate pain or severe pain. ?  ?rosuvastatin 10 MG tablet ?Commonly known as: CRESTOR ?Take 10 mg by mouth daily. ?  ?sulfamethoxazole-trimethoprim 800-160 MG tablet ?Commonly known as: BACTRIM DS ?Take 1 tablet by mouth 2 (two) times daily. Start the day prior to foley removal appointment ?  ? ?  ? ? ?Followup: He will followup in 1 week for catheter removal and to discuss his surgical pathology results. ?  ?

## 2022-03-21 ENCOUNTER — Telehealth: Payer: Self-pay | Admitting: Genetic Counselor

## 2022-03-21 LAB — BPAM RBC
Blood Product Expiration Date: 202305162359
Blood Product Expiration Date: 202306022359
Unit Type and Rh: 1700
Unit Type and Rh: 1700

## 2022-03-21 LAB — TYPE AND SCREEN
ABO/RH(D): B NEG
Antibody Screen: POSITIVE
Unit division: 0
Unit division: 0

## 2022-03-21 NOTE — Telephone Encounter (Signed)
I attempted to contact Jamie Gonzalez to discuss his genetic testing results (36 genes). I left a voicemail requesting he call me back at [phone]. ? ?Lucille Passy, MS, LCGC ?Genetic Counselor ?[email or web address] ?(P) [phone] ? ?

## 2022-03-22 ENCOUNTER — Telehealth: Payer: Self-pay | Admitting: Genetic Counselor

## 2022-03-22 NOTE — Telephone Encounter (Signed)
I contacted Mr. Jamie Gonzalez to discuss his genetic testing results. No pathogenic variants were identified in the 36 genes analyzed. Detailed clinic note to follow. ? ?The test report has been scanned into EPIC and is located under the Molecular Pathology section of the Results Review tab.  A portion of the result report is included below for reference.  ? ?Lucille Passy, MS, [place] ?Genetic Counselor ?[email or web address] ?(P) [phone] ? ? ?

## 2022-03-24 LAB — SURGICAL PATHOLOGY

## 2022-03-31 DIAGNOSIS — C61 Malignant neoplasm of prostate: Secondary | ICD-10-CM | POA: Diagnosis not present

## 2022-04-01 ENCOUNTER — Ambulatory Visit: Payer: Self-pay | Admitting: Genetic Counselor

## 2022-04-01 NOTE — Progress Notes (Signed)
HPI:   Mr. Petter was previously seen in the [place] clinic due to a personal and family history of cancer and concerns regarding a hereditary predisposition to cancer. Please refer to our prior cancer genetics clinic note for more information regarding our discussion, assessment and recommendations, at the time. Mr. Eden recent genetic test results were disclosed to him, as were recommendations warranted by these results. These results and recommendations are discussed in more detail below.  CANCER HISTORY:  Oncology History  Prostate cancer [facility]); high risk; Gleason group 3,4; PSA 14.8  09/23/2021 Initial Diagnosis   Prostate cancer ([place]); high risk; Gleason group 3,4; PSA 14.8    03/01/2022 Cancer Staging   Staging form: Prostate, AJCC 8th Edition - Clinical: Stage IIIB (cT3, cN0, cM0, PSA: 14.6, Grade Group: 4) - Signed by Wyatt Portela, MD on 03/01/2022 Prostate specific antigen (PSA) range: 10 to 19 Gleason score: 8 Histologic grading system: 5 grade system     Genetic Testing   [facility] CancerNext Panel was Negative. Report date is 03/17/2022.  The CancerNext gene panel offered by [employer] includes sequencing, rearrangement analysis, and RNA analysis for the following 36 genes:   APC, ATM, AXIN2, BARD1, BMPR1A, BRCA1, BRCA2, BRIP1, CDH1, CDK4, CDKN2A, CHEK2, DICER1, HOXB13, EPCAM, GREM1, MLH1, MSH2, MSH3, MSH6, MUTYH, NBN, NF1, NTHL1, PALB2, PMS2, POLD1, POLE, PTEN, RAD51C, RAD51D, RECQL, SMAD4, SMARCA4, STK11, and TP53.      FAMILY HISTORY:  We obtained a detailed, 4-generation family history.  Significant diagnoses are listed below:      Family History  Problem Relation Age of Onset   Cancer Maternal Aunt     Breast cancer Niece 42   Cancer Cousin          stomach or peritoneal cancer           Mr. Eves has a niece who was recently diagnosed with breast cancer at age 79 and she had a double mastectomy as part of her treatment. A  maternal aunt was diagnosed with an unknown cancer, she is deceased. A male paternal first cousin was recently diagnosed with either stomach or peritoneal cancer. Mr. Pellum is unaware of previous family history of genetic testing for hereditary cancer risks. There is no reported Ashkenazi Jewish ancestry.   GENETIC TEST RESULTS:  The [facility] CancerNext Panel found no pathogenic mutations.   The CancerNext gene panel offered by [employer] includes sequencing, rearrangement analysis, and RNA analysis for the following 36 genes:   APC, ATM, AXIN2, BARD1, BMPR1A, BRCA1, BRCA2, BRIP1, CDH1, CDK4, CDKN2A, CHEK2, DICER1, HOXB13, EPCAM, GREM1, MLH1, MSH2, MSH3, MSH6, MUTYH, NBN, NF1, NTHL1, PALB2, PMS2, POLD1, POLE, PTEN, RAD51C, RAD51D, RECQL, SMAD4, SMARCA4, STK11, and TP53.    The test report has been scanned into EPIC and is located under the Molecular Pathology section of the Results Review tab.  A portion of the result report is included below for reference. Genetic testing reported out on 03/17/2022.         Even though a pathogenic variant was not identified, possible explanations for his personal history of cancer may include: There may be no hereditary risk for cancer in the family. The cancers in Mr. Simenson and/or his family may be due to other genetic or environmental factors. There may be a gene mutation in one of these genes that current testing methods cannot detect, but that chance is small. There could be another gene that has not yet been discovered, or that we have not  yet tested, that is responsible for the cancer diagnoses in the family.   Therefore, it is important to remain in touch with cancer genetics in the future so that we can continue to offer Mr. Cona the most up to date genetic testing.   ADDITIONAL GENETIC TESTING:  We discussed with Mr. Giovanelli that his genetic testing was fairly extensive.  If there are genes identified to increase cancer risk that can be  analyzed in the future, we would be happy to discuss and coordinate this testing at that time.    CANCER SCREENING RECOMMENDATIONS:  Mr. Vandyke test result is considered negative (normal).  This means that we have not identified a hereditary cause for his personal and family history of cancer at this time. Most cancers happen by chance and this negative test suggests that his cancer may fall into this category.    An individual's cancer risk and medical management are not determined by genetic test results alone. Overall cancer risk assessment incorporates additional factors, including personal medical history, family history, and any available genetic information that may result in a personalized plan for cancer prevention and surveillance. Therefore, it is recommended he continue to follow the cancer management and screening guidelines provided by his oncology and primary healthcare provider.  RECOMMENDATIONS FOR FAMILY MEMBERS:   Since he did not inherit a mutation in a cancer predisposition gene included on this panel, his son could not have inherited a mutation from him in one of these genes.  FOLLOW-UP:  Cancer genetics is a rapidly advancing field and it is possible that new genetic tests will be appropriate for him and/or his family members in the future. We encouraged him to remain in contact with cancer genetics on an annual basis so we can update his personal and family histories and let him know of advances in cancer genetics that may benefit this family.   Our contact number was provided. Mr. Bledsoe questions were answered to his satisfaction, and he knows he is welcome to call us at anytime with additional questions or concerns.   Lucille Passy, MS, [facility] Genetic Counselor [email or web address] (P) [phone]

## 2022-04-06 ENCOUNTER — Encounter: Payer: Self-pay | Admitting: Genetic Counselor

## 2022-04-06 DIAGNOSIS — C61 Malignant neoplasm of prostate: Secondary | ICD-10-CM | POA: Diagnosis not present

## 2022-04-06 DIAGNOSIS — Z6831 Body mass index (BMI) 31.0-31.9, adult: Secondary | ICD-10-CM | POA: Diagnosis not present

## 2022-04-06 DIAGNOSIS — Z9079 Acquired absence of other genital organ(s): Secondary | ICD-10-CM | POA: Diagnosis not present

## 2022-04-14 DIAGNOSIS — M62838 Other muscle spasm: Secondary | ICD-10-CM | POA: Diagnosis not present

## 2022-04-14 DIAGNOSIS — N393 Stress incontinence (female) (male): Secondary | ICD-10-CM | POA: Diagnosis not present

## 2022-04-14 DIAGNOSIS — M6281 Muscle weakness (generalized): Secondary | ICD-10-CM | POA: Diagnosis not present

## 2022-04-26 DIAGNOSIS — C61 Malignant neoplasm of prostate: Secondary | ICD-10-CM | POA: Diagnosis not present

## 2022-04-26 DIAGNOSIS — N138 Other obstructive and reflux uropathy: Secondary | ICD-10-CM | POA: Diagnosis not present

## 2022-05-02 DIAGNOSIS — M6281 Muscle weakness (generalized): Secondary | ICD-10-CM | POA: Diagnosis not present

## 2022-05-02 DIAGNOSIS — N393 Stress incontinence (female) (male): Secondary | ICD-10-CM | POA: Diagnosis not present

## 2022-05-02 DIAGNOSIS — M62838 Other muscle spasm: Secondary | ICD-10-CM | POA: Diagnosis not present

## 2022-06-02 DIAGNOSIS — N393 Stress incontinence (female) (male): Secondary | ICD-10-CM | POA: Diagnosis not present

## 2022-06-02 DIAGNOSIS — C61 Malignant neoplasm of prostate: Secondary | ICD-10-CM | POA: Diagnosis not present

## 2022-06-02 DIAGNOSIS — M62838 Other muscle spasm: Secondary | ICD-10-CM | POA: Diagnosis not present

## 2022-06-02 DIAGNOSIS — M6281 Muscle weakness (generalized): Secondary | ICD-10-CM | POA: Diagnosis not present

## 2022-06-22 DIAGNOSIS — M62838 Other muscle spasm: Secondary | ICD-10-CM | POA: Diagnosis not present

## 2022-06-22 DIAGNOSIS — N393 Stress incontinence (female) (male): Secondary | ICD-10-CM | POA: Diagnosis not present

## 2022-06-22 DIAGNOSIS — M6281 Muscle weakness (generalized): Secondary | ICD-10-CM | POA: Diagnosis not present

## 2022-06-22 DIAGNOSIS — C61 Malignant neoplasm of prostate: Secondary | ICD-10-CM | POA: Diagnosis not present

## 2022-06-22 DIAGNOSIS — N5201 Erectile dysfunction due to arterial insufficiency: Secondary | ICD-10-CM | POA: Diagnosis not present

## 2022-07-19 DIAGNOSIS — M6281 Muscle weakness (generalized): Secondary | ICD-10-CM | POA: Diagnosis not present

## 2022-07-19 DIAGNOSIS — N393 Stress incontinence (female) (male): Secondary | ICD-10-CM | POA: Diagnosis not present

## 2022-07-19 DIAGNOSIS — M62838 Other muscle spasm: Secondary | ICD-10-CM | POA: Diagnosis not present

## 2022-08-29 DIAGNOSIS — M62838 Other muscle spasm: Secondary | ICD-10-CM | POA: Diagnosis not present

## 2022-08-29 DIAGNOSIS — M6281 Muscle weakness (generalized): Secondary | ICD-10-CM | POA: Diagnosis not present

## 2022-08-29 DIAGNOSIS — N393 Stress incontinence (female) (male): Secondary | ICD-10-CM | POA: Diagnosis not present

## 2022-08-31 DIAGNOSIS — E6609 Other obesity due to excess calories: Secondary | ICD-10-CM | POA: Diagnosis not present

## 2022-08-31 DIAGNOSIS — Z6832 Body mass index (BMI) 32.0-32.9, adult: Secondary | ICD-10-CM | POA: Diagnosis not present

## 2022-08-31 DIAGNOSIS — Z9079 Acquired absence of other genital organ(s): Secondary | ICD-10-CM | POA: Diagnosis not present

## 2022-08-31 DIAGNOSIS — Z0001 Encounter for general adult medical examination with abnormal findings: Secondary | ICD-10-CM | POA: Diagnosis not present

## 2022-08-31 DIAGNOSIS — E782 Mixed hyperlipidemia: Secondary | ICD-10-CM | POA: Diagnosis not present

## 2022-08-31 DIAGNOSIS — Z1331 Encounter for screening for depression: Secondary | ICD-10-CM | POA: Diagnosis not present

## 2022-08-31 DIAGNOSIS — C61 Malignant neoplasm of prostate: Secondary | ICD-10-CM | POA: Diagnosis not present

## 2022-09-15 ENCOUNTER — Encounter: Payer: Self-pay | Admitting: *Deleted

## 2022-09-20 DIAGNOSIS — M62838 Other muscle spasm: Secondary | ICD-10-CM | POA: Diagnosis not present

## 2022-09-20 DIAGNOSIS — N393 Stress incontinence (female) (male): Secondary | ICD-10-CM | POA: Diagnosis not present

## 2022-09-20 DIAGNOSIS — M6281 Muscle weakness (generalized): Secondary | ICD-10-CM | POA: Diagnosis not present

## 2022-10-17 DIAGNOSIS — M6281 Muscle weakness (generalized): Secondary | ICD-10-CM | POA: Diagnosis not present

## 2022-10-17 DIAGNOSIS — M62838 Other muscle spasm: Secondary | ICD-10-CM | POA: Diagnosis not present

## 2022-10-17 DIAGNOSIS — N393 Stress incontinence (female) (male): Secondary | ICD-10-CM | POA: Diagnosis not present

## 2022-11-15 DIAGNOSIS — M6281 Muscle weakness (generalized): Secondary | ICD-10-CM | POA: Diagnosis not present

## 2022-11-15 DIAGNOSIS — M62838 Other muscle spasm: Secondary | ICD-10-CM | POA: Diagnosis not present

## 2022-11-15 DIAGNOSIS — N393 Stress incontinence (female) (male): Secondary | ICD-10-CM | POA: Diagnosis not present

## 2023-01-04 DIAGNOSIS — M6281 Muscle weakness (generalized): Secondary | ICD-10-CM | POA: Diagnosis not present

## 2023-01-04 DIAGNOSIS — N393 Stress incontinence (female) (male): Secondary | ICD-10-CM | POA: Diagnosis not present

## 2023-01-04 DIAGNOSIS — C61 Malignant neoplasm of prostate: Secondary | ICD-10-CM | POA: Diagnosis not present

## 2023-01-04 DIAGNOSIS — M62838 Other muscle spasm: Secondary | ICD-10-CM | POA: Diagnosis not present

## 2023-01-11 DIAGNOSIS — N5201 Erectile dysfunction due to arterial insufficiency: Secondary | ICD-10-CM | POA: Diagnosis not present

## 2023-01-11 DIAGNOSIS — N393 Stress incontinence (female) (male): Secondary | ICD-10-CM | POA: Diagnosis not present

## 2023-01-11 DIAGNOSIS — C61 Malignant neoplasm of prostate: Secondary | ICD-10-CM | POA: Diagnosis not present

## 2023-02-07 DIAGNOSIS — M6281 Muscle weakness (generalized): Secondary | ICD-10-CM | POA: Diagnosis not present

## 2023-02-07 DIAGNOSIS — M62838 Other muscle spasm: Secondary | ICD-10-CM | POA: Diagnosis not present

## 2023-02-07 DIAGNOSIS — N393 Stress incontinence (female) (male): Secondary | ICD-10-CM | POA: Diagnosis not present

## 2023-02-16 DIAGNOSIS — Z6831 Body mass index (BMI) 31.0-31.9, adult: Secondary | ICD-10-CM | POA: Diagnosis not present

## 2023-02-16 DIAGNOSIS — R0789 Other chest pain: Secondary | ICD-10-CM | POA: Diagnosis not present

## 2023-02-16 DIAGNOSIS — S4392XA Sprain of unspecified parts of left shoulder girdle, initial encounter: Secondary | ICD-10-CM | POA: Diagnosis not present

## 2023-02-16 DIAGNOSIS — M542 Cervicalgia: Secondary | ICD-10-CM | POA: Diagnosis not present

## 2023-02-16 DIAGNOSIS — E6609 Other obesity due to excess calories: Secondary | ICD-10-CM | POA: Diagnosis not present

## 2023-03-14 ENCOUNTER — Encounter: Payer: Self-pay | Admitting: *Deleted

## 2023-03-30 DIAGNOSIS — M62838 Other muscle spasm: Secondary | ICD-10-CM | POA: Diagnosis not present

## 2023-03-30 DIAGNOSIS — N393 Stress incontinence (female) (male): Secondary | ICD-10-CM | POA: Diagnosis not present

## 2023-03-30 DIAGNOSIS — M6281 Muscle weakness (generalized): Secondary | ICD-10-CM | POA: Diagnosis not present

## 2023-06-01 DIAGNOSIS — M6281 Muscle weakness (generalized): Secondary | ICD-10-CM | POA: Diagnosis not present

## 2023-06-01 DIAGNOSIS — M62838 Other muscle spasm: Secondary | ICD-10-CM | POA: Diagnosis not present

## 2023-06-01 DIAGNOSIS — N393 Stress incontinence (female) (male): Secondary | ICD-10-CM | POA: Diagnosis not present

## 2023-06-28 ENCOUNTER — Encounter: Payer: Self-pay | Admitting: *Deleted

## 2023-07-12 ENCOUNTER — Telehealth: Payer: Self-pay | Admitting: Internal Medicine

## 2023-07-12 NOTE — Telephone Encounter (Signed)
Questionnaire is in review.

## 2023-07-13 NOTE — Telephone Encounter (Addendum)
  Procedure: COLONOSCOPY  Height: 6'0 Weight: 235LBS        Have you had a colonoscopy before?  08/19/14, Dr. Jena Gauss  Do you have family history of colon cancer?  no  Do you have a family history of polyps? yes  Previous colonoscopy with polyps removed? yes  Do you have a history colorectal cancer?   no  Are you diabetic?  no  Do you have a prosthetic or mechanical heart valve? no  Do you have a pacemaker/defibrillator?   no  Have you had endocarditis/atrial fibrillation?  no  Do you use supplemental oxygen/CPAP?  no  Have you had joint replacement within the last 12 months?  no  Do you tend to be constipated or have to use laxatives?  no   Do you have history of alcohol use? If yes, how much and how often.  no  Do you have history or are you using drugs? If yes, what do are you  using?  no  Have you ever had a stroke/heart attack?  no  Have you ever had a heart or other vascular stent placed,?no  Do you take weight loss medication? no  Do you take any blood-thinning medications such as: (Plavix, aspirin, Coumadin, Aggrenox, Brilinta, Xarelto, Eliquis, Pradaxa, Savaysa or Effient)? no  If yes we need the name, milligram, dosage and who is prescribing doctor:              Current Outpatient Medications on File Prior to Visit  Medication Sig Dispense Refill   rosuvastatin (CRESTOR) 10 MG tablet Take 10 mg by mouth daily.     sildenafil (VIAGRA) 100 MG tablet Take 100 mg by mouth daily as needed for erectile dysfunction.     No current facility-administered medications on file prior to visit.       Allergies  Allergen Reactions   Other     PT reports breaking out in hives and itching after CT dye injection

## 2023-07-13 NOTE — Addendum Note (Signed)
Addended by: Armstead Peaks on: 07/13/2023 08:39 AM   Modules accepted: Orders

## 2023-07-26 DIAGNOSIS — M62838 Other muscle spasm: Secondary | ICD-10-CM | POA: Diagnosis not present

## 2023-07-26 DIAGNOSIS — C61 Malignant neoplasm of prostate: Secondary | ICD-10-CM | POA: Diagnosis not present

## 2023-07-26 DIAGNOSIS — M6281 Muscle weakness (generalized): Secondary | ICD-10-CM | POA: Diagnosis not present

## 2023-07-26 DIAGNOSIS — N393 Stress incontinence (female) (male): Secondary | ICD-10-CM | POA: Diagnosis not present

## 2023-07-31 NOTE — Telephone Encounter (Signed)
LMTCB

## 2023-08-02 DIAGNOSIS — N393 Stress incontinence (female) (male): Secondary | ICD-10-CM | POA: Diagnosis not present

## 2023-08-02 DIAGNOSIS — C61 Malignant neoplasm of prostate: Secondary | ICD-10-CM | POA: Diagnosis not present

## 2023-08-02 DIAGNOSIS — N5201 Erectile dysfunction due to arterial insufficiency: Secondary | ICD-10-CM | POA: Diagnosis not present

## 2023-08-24 ENCOUNTER — Encounter: Payer: Self-pay | Admitting: *Deleted

## 2023-08-24 ENCOUNTER — Other Ambulatory Visit: Payer: Self-pay | Admitting: *Deleted

## 2023-08-24 MED ORDER — PEG 3350-KCL-NA BICARB-NACL 420 G PO SOLR: 4000.0000 mL | Freq: Once | ORAL | 0 refills | Status: AC

## 2023-08-24 NOTE — Telephone Encounter (Signed)
Pt has been scheduled for 09/21/23, instructions mailed and prep sent to the pharmacy.

## 2023-08-24 NOTE — Telephone Encounter (Signed)
LMOVM to call back. Letter mailed. ?

## 2023-08-29 ENCOUNTER — Encounter: Payer: Self-pay | Admitting: *Deleted

## 2023-08-29 NOTE — Telephone Encounter (Signed)
Referral completed, [facility]CS apt letter sent to PCP

## 2023-09-14 DIAGNOSIS — Z6832 Body mass index (BMI) 32.0-32.9, adult: Secondary | ICD-10-CM | POA: Diagnosis not present

## 2023-09-14 DIAGNOSIS — Z1331 Encounter for screening for depression: Secondary | ICD-10-CM | POA: Diagnosis not present

## 2023-09-14 DIAGNOSIS — Z9079 Acquired absence of other genital organ(s): Secondary | ICD-10-CM | POA: Diagnosis not present

## 2023-09-14 DIAGNOSIS — E782 Mixed hyperlipidemia: Secondary | ICD-10-CM | POA: Diagnosis not present

## 2023-09-14 DIAGNOSIS — E6609 Other obesity due to excess calories: Secondary | ICD-10-CM | POA: Diagnosis not present

## 2023-09-14 DIAGNOSIS — C61 Malignant neoplasm of prostate: Secondary | ICD-10-CM | POA: Diagnosis not present

## 2023-09-14 DIAGNOSIS — Z0001 Encounter for general adult medical examination with abnormal findings: Secondary | ICD-10-CM | POA: Diagnosis not present

## 2023-09-20 NOTE — Anesthesia Preprocedure Evaluation (Signed)
Anesthesia Evaluation  Patient identified by MRN, date of birth, ID band Patient awake    Reviewed: Allergy & Precautions, NPO status , Patient's Chart, lab work & pertinent test results  History of Anesthesia Complications Negative for: history of anesthetic complications  Airway Mallampati: III  TM Distance: >3 FB Neck ROM: Full    Dental no notable dental hx. (+) Dental Advisory Given, Teeth Intact   Pulmonary former smoker   Pulmonary exam normal breath sounds clear to auscultation       Cardiovascular negative cardio ROS Normal cardiovascular exam Rhythm:Regular Rate:Normal     Neuro/Psych negative neurological ROS  negative psych ROS   GI/Hepatic negative GI ROS, Neg liver ROS,,,  Endo/Other  negative endocrine ROS    Renal/GU negative Renal ROS     Musculoskeletal negative musculoskeletal ROS (+)    Abdominal   Peds  Hematology negative hematology ROS (+)   Anesthesia Other Findings Prostate ca.  Tubular adenoma  Reproductive/Obstetrics negative OB ROS                             Anesthesia Physical Anesthesia Plan  ASA: 2  Anesthesia Plan: General   Post-op Pain Management: Minimal or no pain anticipated   Induction: Intravenous  PONV Risk Score and Plan: Propofol infusion  Airway Management Planned: Nasal Cannula and Natural Airway  Additional Equipment: None  Intra-op Plan:   Post-operative Plan:   Informed Consent: I have reviewed the patients History and Physical, chart, labs and discussed the procedure including the risks, benefits and alternatives for the proposed anesthesia with the patient or authorized representative who has indicated his/her understanding and acceptance.     Dental advisory given  Plan Discussed with: CRNA  Anesthesia Plan Comments: (PIV x2)        Anesthesia Quick Evaluation

## 2023-09-21 ENCOUNTER — Encounter (HOSPITAL_COMMUNITY): Payer: Self-pay | Admitting: Internal Medicine

## 2023-09-21 ENCOUNTER — Ambulatory Visit (HOSPITAL_BASED_OUTPATIENT_CLINIC_OR_DEPARTMENT_OTHER): Payer: Medicare HMO | Admitting: Anesthesiology

## 2023-09-21 ENCOUNTER — Ambulatory Visit (HOSPITAL_COMMUNITY)
Admission: RE | Admit: 2023-09-21 | Discharge: 2023-09-21 | Disposition: A | Discharge status: Home / Self Care | Payer: Medicare HMO | Attending: Internal Medicine | Admitting: Internal Medicine

## 2023-09-21 ENCOUNTER — Ambulatory Visit (HOSPITAL_COMMUNITY): Payer: Self-pay | Admitting: Anesthesiology

## 2023-09-21 ENCOUNTER — Encounter (HOSPITAL_COMMUNITY)
Admission: RE | Disposition: A | Discharge status: Home / Self Care | Payer: Self-pay | Source: Home / Self Care | Attending: Internal Medicine

## 2023-09-21 ENCOUNTER — Other Ambulatory Visit: Payer: Self-pay

## 2023-09-21 DIAGNOSIS — K635 Polyp of colon: Secondary | ICD-10-CM | POA: Diagnosis not present

## 2023-09-21 DIAGNOSIS — D127 Benign neoplasm of rectosigmoid junction: Secondary | ICD-10-CM | POA: Insufficient documentation

## 2023-09-21 DIAGNOSIS — Z8546 Personal history of malignant neoplasm of prostate: Secondary | ICD-10-CM | POA: Insufficient documentation

## 2023-09-21 DIAGNOSIS — D124 Benign neoplasm of descending colon: Secondary | ICD-10-CM | POA: Diagnosis not present

## 2023-09-21 DIAGNOSIS — Z860100 Personal history of colon polyps, unspecified: Secondary | ICD-10-CM

## 2023-09-21 DIAGNOSIS — Z860101 Personal history of adenomatous and serrated colon polyps: Secondary | ICD-10-CM | POA: Insufficient documentation

## 2023-09-21 DIAGNOSIS — D122 Benign neoplasm of ascending colon: Secondary | ICD-10-CM | POA: Insufficient documentation

## 2023-09-21 DIAGNOSIS — Z1211 Encounter for screening for malignant neoplasm of colon: Secondary | ICD-10-CM | POA: Insufficient documentation

## 2023-09-21 DIAGNOSIS — D128 Benign neoplasm of rectum: Secondary | ICD-10-CM

## 2023-09-21 DIAGNOSIS — D126 Benign neoplasm of colon, unspecified: Secondary | ICD-10-CM | POA: Diagnosis not present

## 2023-09-21 DIAGNOSIS — Z87891 Personal history of nicotine dependence: Secondary | ICD-10-CM | POA: Insufficient documentation

## 2023-09-21 HISTORY — PX: POLYPECTOMY: SHX5525

## 2023-09-21 HISTORY — PX: COLONOSCOPY WITH PROPOFOL: SHX5780

## 2023-09-21 SURGERY — COLONOSCOPY WITH PROPOFOL
Anesthesia: General

## 2023-09-21 MED ORDER — GLYCOPYRROLATE PF 0.2 MG/ML IJ SOSY
PREFILLED_SYRINGE | INTRAMUSCULAR | Status: DC | PRN
Start: 2023-09-21 — End: 2023-09-21
  Administered 2023-09-21: .2 mg via INTRAVENOUS

## 2023-09-21 MED ORDER — PROPOFOL 1000 MG/100ML IV EMUL
INTRAVENOUS | Status: AC
  Filled 2023-09-21: qty 100

## 2023-09-21 MED ORDER — PHENYLEPHRINE 80 MCG/ML (10ML) SYRINGE FOR IV PUSH (FOR BLOOD PRESSURE SUPPORT)
PREFILLED_SYRINGE | INTRAVENOUS | Status: DC | PRN
  Administered 2023-09-21: 160 ug via INTRAVENOUS

## 2023-09-21 MED ORDER — LIDOCAINE HCL (PF) 2 % IJ SOLN
INTRAMUSCULAR | Status: AC
  Filled 2023-09-21: qty 5

## 2023-09-21 MED ORDER — PROPOFOL 500 MG/50ML IV EMUL
INTRAVENOUS | Status: DC | PRN
  Administered 2023-09-21: 150 ug/kg/min via INTRAVENOUS

## 2023-09-21 MED ORDER — LACTATED RINGERS IV SOLN: INTRAVENOUS | Status: DC | PRN

## 2023-09-21 MED ORDER — PHENYLEPHRINE 80 MCG/ML (10ML) SYRINGE FOR IV PUSH (FOR BLOOD PRESSURE SUPPORT)
PREFILLED_SYRINGE | INTRAVENOUS | Status: AC
  Filled 2023-09-21: qty 10

## 2023-09-21 MED ORDER — LIDOCAINE HCL (CARDIAC) PF 100 MG/5ML IV SOSY
PREFILLED_SYRINGE | INTRAVENOUS | Status: DC | PRN
  Administered 2023-09-21: 60 mg via INTRAVENOUS

## 2023-09-21 MED ORDER — PROPOFOL 10 MG/ML IV BOLUS
INTRAVENOUS | Status: DC | PRN
  Administered 2023-09-21: 100 mg via INTRAVENOUS

## 2023-09-21 NOTE — Op Note (Addendum)
[facility] Patient Name: Jamie Gonzalez Procedure Date: 09/21/2023 7:27 AM MRN: [id number] Date of Birth: 1956-08-19 Attending MD: Gennette Pac , MD, [id number] CSN: [id number] Age: 67 Admit Type: Outpatient Procedure:                Colonoscopy Indications:              High risk colon cancer surveillance: Personal                            history of colonic polyps Providers:                Gennette Pac, MD, Francoise Ceo RN, RN, Nena Polio, RN, Kristine L. Jessee Avers, Technician Referring MD:              Medicines:                Propofol per Anesthesia Complications:            No immediate complications. Estimated Blood Loss:     Estimated blood loss was minimal. Estimated blood                            loss was minimal. Procedure:                Pre-Anesthesia Assessment:                           - Prior to the procedure, a History and Physical                            was performed, and patient medications and                            allergies were reviewed. The patient's tolerance of                            previous anesthesia was also reviewed. The risks                            and benefits of the procedure and the sedation                            options and risks were discussed with the patient.                            All questions were answered, and informed consent                            was obtained. Prior Anticoagulants: The patient has                            taken no anticoagulant or antiplatelet agents. ASA  Grade Assessment: II - A patient with mild systemic                            disease. After reviewing the risks and benefits,                            the patient was deemed in satisfactory condition to                            undergo the procedure.                           After obtaining informed consent, the colonoscope                             was passed under direct vision. Throughout the                            procedure, the patient's blood pressure, pulse, and                            oxygen saturations were monitored continuously. The                            [id number] scope was introduced through the                            anus and advanced to the the cecum, identified by                            appendiceal orifice and ileocecal valve. The                            colonoscopy was performed without difficulty. The                            patient tolerated the procedure well. The quality                            of the bowel preparation was adequate. The                            ileocecal valve, appendiceal orifice, and rectum                            were photographed. Scope In: 7:57:59 AM Scope Out: 8:12:07 AM Scope Withdrawal Time: 0 hours 12 minutes 17 seconds  Total Procedure Duration: 0 hours 14 minutes 8 seconds  Findings:      The perianal and digital rectal examinations were normal.      Four semi-pedunculated polyps were found in the distal rectum,       descending colon and ascending colon. The polyps were 3 to 8 mm in size.       These polyps were removed with a cold snare. Resection and retrieval  were complete. Estimated blood loss was minimal. Submitted separately      The exam was otherwise without abnormality on direct and retroflexion       views. Impression:               - Four 3 to 8 mm polyps in the distal rectum, in                            the descending colon and in the ascending colon,                            removed with a cold snare. Resected and retrieved. Moderate Sedation:      Moderate (conscious) sedation was personally administered by an       anesthesia professional. The following parameters were monitored: oxygen       saturation, heart rate, blood pressure, respiratory rate, EKG, adequacy       of pulmonary ventilation, and response to  care. Recommendation:           - Patient has a contact number available for                            emergencies. The signs and symptoms of potential                            delayed complications were discussed with the                            patient. Return to normal activities tomorrow.                            Written discharge instructions were provided to the                            patient.                           - Advance diet as tolerated.                           - Continue present medications.                           - Repeat colonoscopy date to be determined after                            pending pathology results are reviewed for                            surveillance.                           - Return to GI office (date not yet determined). Procedure Code(s):        --- Professional ---                           [phone], Colonoscopy, flexible; with  removal of                            tumor(s), polyp(s), or other lesion(s) by snare                            technique Diagnosis Code(s):        --- Professional ---                           Z86.010, Personal history of colonic polyps                           D12.8, Benign neoplasm of rectum                           D12.4, Benign neoplasm of descending colon                           D12.2, Benign neoplasm of ascending colon CPT copyright 2022 American Medical Association. All rights reserved. The codes documented in this report are preliminary and upon coder review may  be revised to meet current compliance requirements. Gerrit Friends. Khaniya Tenaglia, MD Gennette Pac, MD 09/21/2023 8:28:26 AM This report has been signed electronically. Number of Addenda: 0

## 2023-09-21 NOTE — Anesthesia Postprocedure Evaluation (Signed)
Anesthesia Post Note  Patient: Jamie Gonzalez  Procedure(s) Performed: COLONOSCOPY WITH PROPOFOL POLYPECTOMY  Patient location during evaluation: PACU Anesthesia Type: General Level of consciousness: awake and alert Pain management: pain level controlled Vital Signs Assessment: post-procedure vital signs reviewed and stable Respiratory status: spontaneous breathing, nonlabored ventilation, respiratory function stable and patient connected to nasal cannula oxygen Cardiovascular status: blood pressure returned to baseline and stable Postop Assessment: no apparent nausea or vomiting Anesthetic complications: no   There were no known notable events for this encounter.   Last Vitals:  Vitals:   09/21/23 0700 09/21/23 0820  BP: 129/86 118/72  Pulse: 66 68  Resp: 16 15  Temp: 36.5 C 36.5 C  SpO2: 99% 97%    Last Pain:  Vitals:   09/21/23 0820  TempSrc: Oral  PainSc: 0-No pain                 Bascom Biel L Pebble Botkin

## 2023-09-21 NOTE — Discharge Instructions (Addendum)
  Colonoscopy Discharge Instructions  Read the instructions outlined below and refer to this sheet in the next few weeks. These discharge instructions provide you with general information on caring for yourself after you leave the hospital. Your doctor may also give you specific instructions. While your treatment has been planned according to the most current medical practices available, unavoidable complications occasionally occur. If you have any problems or questions after discharge, call Dr. Jena Gauss at [phone]. ACTIVITY You may resume your regular activity, but move at a slower pace for the next 24 hours.  Take frequent rest periods for the next 24 hours.  Walking will help get rid of the air and reduce the bloated feeling in your belly (abdomen).  No driving for 24 hours (because of the medicine (anesthesia) used during the test).   Do not sign any important legal documents or operate any machinery for 24 hours (because of the anesthesia used during the test).  NUTRITION Drink plenty of fluids.  You may resume your normal diet as instructed by your doctor.  Begin with a light meal and progress to your normal diet. Heavy or fried foods are harder to digest and may make you feel sick to your stomach (nauseated).  Avoid alcoholic beverages for 24 hours or as instructed.  MEDICATIONS You may resume your normal medications unless your doctor tells you otherwise.  WHAT YOU CAN EXPECT TODAY Some feelings of bloating in the abdomen.  Passage of more gas than usual.  Spotting of blood in your stool or on the toilet paper.  IF YOU HAD POLYPS REMOVED DURING THE COLONOSCOPY: No aspirin products for 7 days or as instructed.  No alcohol for 7 days or as instructed.  Eat a soft diet for the next 24 hours.  FINDING OUT THE RESULTS OF YOUR TEST Not all test results are available during your visit. If your test results are not back during the visit, make an appointment with your caregiver to find out the  results. Do not assume everything is normal if you have not heard from your caregiver or the medical facility. It is important for you to follow up on all of your test results.  SEEK IMMEDIATE MEDICAL ATTENTION IF: You have more than a spotting of blood in your stool.  Your belly is swollen (abdominal distention).  You are nauseated or vomiting.  You have a temperature over 101.  You have abdominal pain or discomfort that is severe or gets worse throughout the day.     4 polyps removed from your colon today  Further recommendations to follow pending review of pathology report  At patient request, I called Adela Lank at [phone] findings.

## 2023-09-21 NOTE — Transfer of Care (Addendum)
Immediate Anesthesia Transfer of Care Note  Patient: Jamie Gonzalez  Procedure(s) Performed: COLONOSCOPY WITH PROPOFOL POLYPECTOMY  Patient Location: Endoscopy Unit  Anesthesia Type:General  Level of Consciousness: drowsy and patient cooperative  Airway & Oxygen Therapy: Patient Spontanous Breathing and Patient connected to nasal cannula oxygen  Post-op Assessment: Report given to RN and Post -op Vital signs reviewed and stable  Post vital signs: Reviewed and stable  Last Vitals:  Vitals Value Taken Time  BP 118/72 09/21/23   0820  Temp 36.5 09/21/23   0820  Pulse 68 09/21/23   0820  Resp 15 09/21/23   0820  SpO2 97% 09/21/23   0820    Last Pain:  Vitals:   09/21/23 0700  TempSrc: Oral  PainSc: 0-No pain      Patients Stated Pain Goal: 8 (09/21/23 0700)  Complications: No notable events documented.

## 2023-09-21 NOTE — H&P (Signed)
@LOGO @   Primary Care Physician:  Assunta Found, MD Primary Gastroenterologist:  Dr. Jena Gauss  Pre-Procedure History & Physical: HPI:  Jamie Gonzalez is a 67 y.o. male here for history of colonic adenoma.  Last colonoscopy 2015-no polyps; he is here for follow-up examination.  Past Medical History:  Diagnosis Date   Cancer [facility])    prostate cancer   Tubular adenoma     Past Surgical History:  Procedure Laterality Date   COLONOSCOPY  05/22/2009   Dr.Gurfateh Mcclain- normal rectum, polyps at the splenic flexure and sigmoid, the remainder of the colonic mucosa appeared normal. bx= tubular adenoma   COLONOSCOPY N/A 08/19/2014   Procedure: COLONOSCOPY;  Surgeon: Corbin Ade, MD;  Location: [facility];  Service: Endoscopy;  Laterality: N/A;  1130   LYMPHADENECTOMY Bilateral 03/17/2022   Procedure: LYMPHADENECTOMY, PELVIC;  Surgeon: Heloise Purpura, MD;  Location: [facility];  Service: Urology;  Laterality: Bilateral;   ROBOT ASSISTED LAPAROSCOPIC RADICAL PROSTATECTOMY N/A 03/17/2022   Procedure: XI ROBOTIC ASSISTED LAPAROSCOPIC RADICAL PROSTATECTOMY LEVEL 2, RIGHT URETERAL STENT PLACEMENT;  Surgeon: Heloise Purpura, MD;  Location: [facility];  Service: Urology;  Laterality: N/A;    Prior to Admission medications   Medication Sig Start Date End Date Taking? Authorizing Provider  rosuvastatin (CRESTOR) 10 MG tablet Take 10 mg by mouth daily. 04/08/21  Yes [provider]  sildenafil (VIAGRA) 100 MG tablet Take 100 mg by mouth daily as needed for erectile dysfunction.    [provider]    Allergies as of 08/24/2023 - Review Complete 07/13/2023  Allergen Reaction Noted   Other  03/04/2022    Family History  Problem Relation Age of Onset   Cancer Maternal Aunt    Breast cancer Niece 70   Cancer Cousin        stomach or peritoneal cancer    Social History   Socioeconomic History   Marital status: Married    Spouse name: Not on file   Number of children: Not on file   Years  of education: Not on file   Highest education level: Not on file  Occupational History   Not on file  Tobacco Use   Smoking status: Former    Current packs/day: 0.50    Types: Cigarettes   Smokeless tobacco: Never   Tobacco comments:    1/2 pack daily  Vaping Use   Vaping status: Never Used  Substance and Sexual Activity   Alcohol use: Yes    Comment: occ beer   Drug use: Never   Sexual activity: Not on file  Other Topics Concern   Not on file  Social History Narrative   Not on file   Social Determinants of Health   Financial Resource Strain: Low Risk  (09/08/2021)   Received from [facility], [facility]   Overall Financial Resource Strain (CARDIA)    Difficulty of Paying Living Expenses: Not hard at all  Food Insecurity: No Food Insecurity (09/08/2021)   Received from [facility], [facility]   Hunger Vital Sign    Worried About Running Out of Food in the Last Year: Never true    Ran Out of Food in the Last Year: Never true  Transportation Needs: No Transportation Needs (09/08/2021)   Received from [facility], [facility]   [facility] - Transportation    Lack of Transportation (Medical): No    Lack of Transportation (Non-Medical): No  Physical Activity: Insufficiently Active (09/08/2021)   Received  from [facility], [facility]   Exercise Vital Sign    Days of Exercise per Week: 2 days    Minutes of Exercise per Session: 40 min  Stress: No Stress Concern Present (09/08/2021)   Received from [facility], [facility] - Occupational Stress Questionnaire    Feeling of Stress : Only a little  Social Connections: Moderately Integrated (09/08/2021)   Received from [facility], [facility]   Social Connection and Isolation Panel [NHANES]    Frequency of Communication with Friends and Family: More than three times a week    Frequency of Social Gatherings with Friends and Family: More than  three times a week    Attends Religious Services: More than 4 times per year    Active Member of [employer] or Organizations: No    Attends Banker Meetings: Never    Marital Status: Married  Catering manager Violence: Not At Risk (09/08/2021)   Received from [facility], [facility]   Humiliation, Afraid, Rape, and Kick questionnaire    Fear of Current or Ex-Partner: No    Emotionally Abused: No    Physically Abused: No    Sexually Abused: No    Review of Systems: See HPI, otherwise negative ROS  Physical Exam: BP 129/86   Pulse 66   Temp 97.7 F (36.5 C) (Oral)   Resp 16   Ht 6' (1.829 m)   Wt 105.7 kg   SpO2 99%   BMI 31.60 kg/m  General:   Alert,  Well-developed, well-nourished, pleasant and cooperative in NAD Neck:  Supple; no masses or thyromegaly. No significant cervical adenopathy. Lungs:  Clear throughout to auscultation.   No wheezes, crackles, or rhonchi. No acute distress. Heart:  Regular rate and rhythm; no murmurs, clicks, rubs,  or gallops. Abdomen: Non-distended, normal bowel sounds.  Soft and nontender without appreciable mass or hepatosplenomegaly.   Impression/Plan: 67 year old gentleman with a distant history of colonic adenoma; negative colonoscopy 2015.  Here for follow-up examination.  Prostatectomy last year for prostate cancer. The risks, benefits, limitations, alternatives and imponderables have been reviewed with the patient. Questions have been answered. All parties are agreeable.       Notice: This dictation was prepared with Dragon dictation along with smaller phrase technology. Any transcriptional errors that result from this process are unintentional and may not be corrected upon review.

## 2023-09-22 LAB — SURGICAL PATHOLOGY

## 2023-09-23 ENCOUNTER — Encounter: Payer: Self-pay | Admitting: Internal Medicine

## 2023-09-25 DIAGNOSIS — M62838 Other muscle spasm: Secondary | ICD-10-CM | POA: Diagnosis not present

## 2023-09-25 DIAGNOSIS — M6281 Muscle weakness (generalized): Secondary | ICD-10-CM | POA: Diagnosis not present

## 2023-09-25 DIAGNOSIS — N393 Stress incontinence (female) (male): Secondary | ICD-10-CM | POA: Diagnosis not present

## 2023-09-27 ENCOUNTER — Encounter (HOSPITAL_COMMUNITY): Payer: Self-pay | Admitting: Internal Medicine
